# Patient Record
Sex: Male | Born: 1965
Health system: Southern US, Community
[De-identification: ages and names within clinical notes are randomized; demographics above are authoritative.]

## PROBLEM LIST (undated history)

## (undated) DIAGNOSIS — B019 Varicella without complication: Secondary | ICD-10-CM

## (undated) DIAGNOSIS — K219 Gastro-esophageal reflux disease without esophagitis: Secondary | ICD-10-CM

## (undated) DIAGNOSIS — T7840XA Allergy, unspecified, initial encounter: Secondary | ICD-10-CM

## (undated) HISTORY — DX: Allergy, unspecified, initial encounter: T78.40XA

## (undated) HISTORY — PX: KNEE ARTHROSCOPY W/ MEDIAL COLLATERAL LIGAMENT (MCL) REPAIR: SHX1876

## (undated) HISTORY — PX: SHOULDER SURGERY: SHX246

## (undated) HISTORY — PX: KNEE CARTILAGE SURGERY: SHX688

## (undated) HISTORY — DX: Gastro-esophageal reflux disease without esophagitis: K21.9

## (undated) HISTORY — DX: Varicella without complication: B01.9

## (undated) HISTORY — PX: ANTERIOR CRUCIATE LIGAMENT REPAIR: SHX115

---

## 2004-08-26 HISTORY — PX: NECK SURGERY: SHX720

## 2004-12-05 ENCOUNTER — Ambulatory Visit: Payer: Self-pay | Admitting: Orthopaedic Surgery

## 2004-12-18 ENCOUNTER — Ambulatory Visit: Payer: Self-pay | Admitting: Neurosurgery

## 2004-12-31 ENCOUNTER — Ambulatory Visit (HOSPITAL_COMMUNITY): Admission: RE | Admit: 2004-12-31 | Discharge: 2005-01-01 | Payer: Self-pay | Admitting: Neurosurgery

## 2005-01-29 ENCOUNTER — Encounter: Admission: RE | Admit: 2005-01-29 | Discharge: 2005-01-29 | Payer: Self-pay | Admitting: Neurosurgery

## 2005-04-16 ENCOUNTER — Encounter: Admission: RE | Admit: 2005-04-16 | Discharge: 2005-04-16 | Payer: Self-pay | Admitting: Neurosurgery

## 2005-11-21 IMAGING — RF DG CERVICAL SPINE 2 OR 3 VIEWS
1 series · 2 of 2 positions shown · non-contrast
Comparison: none

CLINICAL DATA: 39-year-old.  C6-7 fusion. 
CERVICAL SPINE - 1 VIEW:
Lateral cervical spine films from the operating room demonstrate anterior plate and screws and interbody bone plug fusion at C6-7.  Normal alignment.  No complicating features.

[Series 1: run · 2 of 2 slices shown]
[im 1/2]
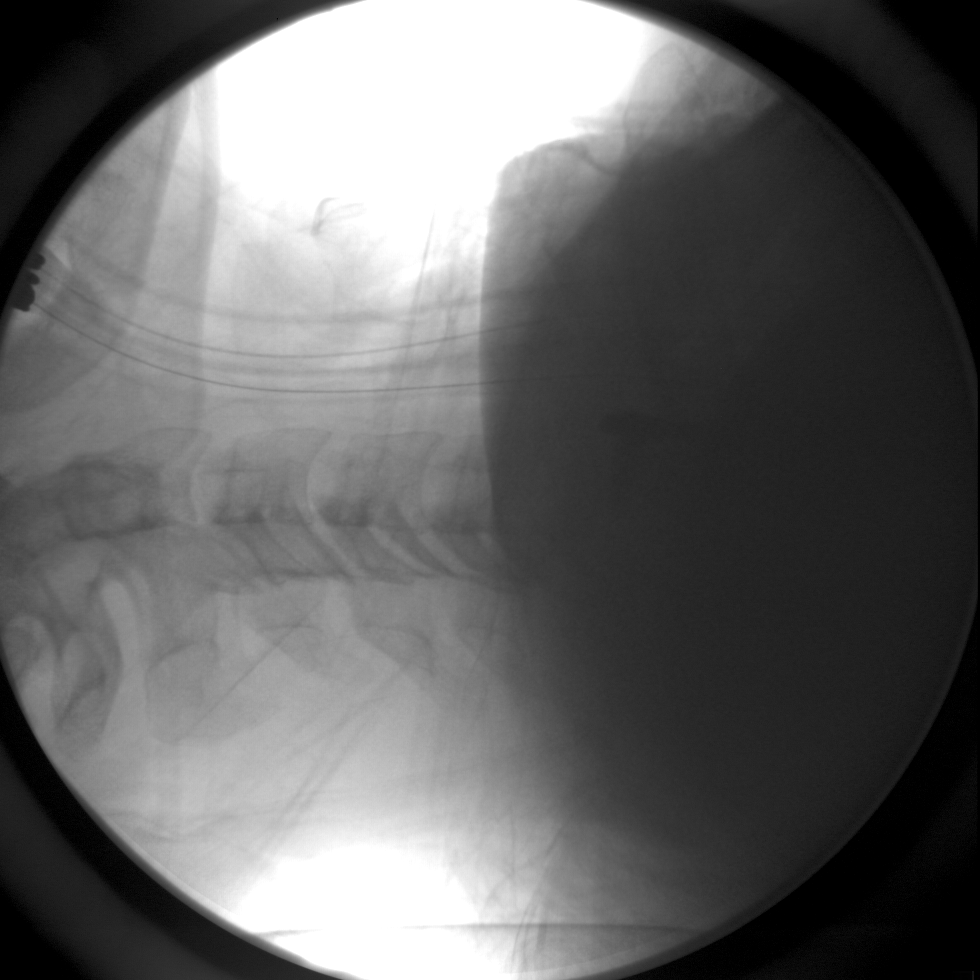
[im 2/2]
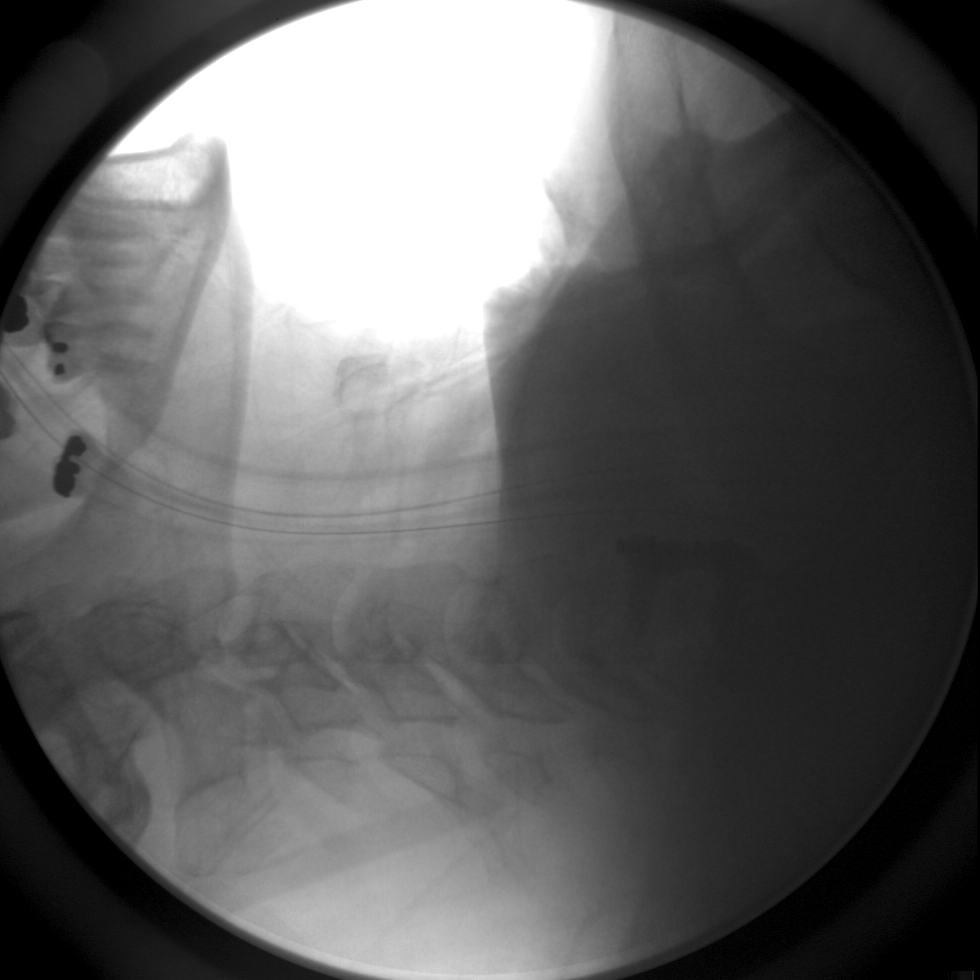

[2 of 2 positions shown; findings below may reference images not displayed]

IMPRESSION: C6-7 fusion.

## 2008-04-25 ENCOUNTER — Encounter: Payer: Self-pay | Admitting: Family Medicine

## 2008-04-25 LAB — CONVERTED CEMR LAB: LDL Cholesterol: 59 mg/dL

## 2008-05-17 ENCOUNTER — Ambulatory Visit: Payer: Self-pay | Admitting: Family Medicine

## 2008-05-17 DIAGNOSIS — R0789 Other chest pain: Secondary | ICD-10-CM | POA: Insufficient documentation

## 2008-05-17 DIAGNOSIS — J302 Other seasonal allergic rhinitis: Secondary | ICD-10-CM | POA: Insufficient documentation

## 2008-05-17 DIAGNOSIS — R519 Headache, unspecified: Secondary | ICD-10-CM | POA: Insufficient documentation

## 2008-05-17 DIAGNOSIS — K219 Gastro-esophageal reflux disease without esophagitis: Secondary | ICD-10-CM | POA: Insufficient documentation

## 2008-05-17 DIAGNOSIS — R51 Headache: Secondary | ICD-10-CM | POA: Insufficient documentation

## 2008-05-18 ENCOUNTER — Encounter: Payer: Self-pay | Admitting: Family Medicine

## 2008-05-30 ENCOUNTER — Ambulatory Visit: Payer: Self-pay

## 2008-05-30 ENCOUNTER — Encounter: Payer: Self-pay | Admitting: Family Medicine

## 2008-11-02 ENCOUNTER — Ambulatory Visit: Payer: Self-pay | Admitting: Family Medicine

## 2008-11-02 LAB — CONVERTED CEMR LAB
Bilirubin Urine: NEGATIVE
Nitrite: NEGATIVE

## 2010-03-01 ENCOUNTER — Ambulatory Visit: Payer: Self-pay | Admitting: Orthopaedic Surgery

## 2010-04-25 ENCOUNTER — Ambulatory Visit: Payer: Self-pay | Admitting: Orthopaedic Surgery
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
## 2013-12-02 ENCOUNTER — Institutional Professional Consult (permissible substitution): Payer: Self-pay | Admitting: Pulmonary Disease

## 2013-12-24 ENCOUNTER — Encounter: Payer: Self-pay | Admitting: Pulmonary Disease

## 2013-12-27 ENCOUNTER — Institutional Professional Consult (permissible substitution): Payer: Self-pay | Admitting: Pulmonary Disease

## 2014-01-19 ENCOUNTER — Encounter: Payer: Self-pay | Admitting: Pulmonary Disease

## 2014-01-19 ENCOUNTER — Ambulatory Visit (INDEPENDENT_AMBULATORY_CARE_PROVIDER_SITE_OTHER): Payer: BC Managed Care – PPO | Admitting: Pulmonary Disease

## 2014-01-19 ENCOUNTER — Encounter (INDEPENDENT_AMBULATORY_CARE_PROVIDER_SITE_OTHER): Payer: Self-pay

## 2014-01-19 VITALS — BP 140/90 | HR 75 | Temp 98.1°F | Ht 70.0 in | Wt 222.6 lb

## 2014-01-19 DIAGNOSIS — G4733 Obstructive sleep apnea (adult) (pediatric): Secondary | ICD-10-CM | POA: Insufficient documentation

## 2014-01-19 NOTE — Assessment & Plan Note (Signed)
The patient has a history of sleep apnea in the past, but did not do well with CPAP. However, it does not appear that he had good followup or trouble shooting. The patient is not symptomatic during the day, but does have restless and nonrestorative sleep, and also interferes with his wife's sleep. He is interested in treating this aggressively while he is working on weight loss. The severity of his sleep apnea really makes a difference, and we'll therefore do home sleep testing to reevaluate. If he has moderate or less sleep apnea, he can consider surgery, dental appliance, or just take 6 months to continue working on weight loss.

## 2014-01-19 NOTE — Patient Instructions (Signed)
Will schedule for home sleep testing, and will call once results are available. Work on weight reduction 

## 2014-01-19 NOTE — Progress Notes (Signed)
Subjective:    Patient ID: Troy Perry, male    DOB: 27-Mar-1966, 48 y.o.   MRN: [id number]  HPI The patient is a 48 year old male who I've been asked to see for obstructive sleep apnea. He apparently had a sleep study 7-8 years ago, and was told that he had mild to moderate sleep apnea. He was started on CPAP, but had no input for followup for his device. He could not tolerate the pressure, and therefore discontinued. He currently has been told that he has loud snoring as well as restless sleep. He notes choking arousals during the night, as well as frequent awakenings. He is not rested in the mornings upon arising. However, he has no issues with inappropriate daytime sleepiness or in the evening, and no sleepiness with driving. He is concerned about the impact to his spouse his sleep. The patient states that his weight is neutral from his previous sleep study, and his Epworth score today is only 1   Sleep Questionnaire What time do you typically go to bed?( Between what hours) 10-11pm 10-11pm at 1022 on 01/19/14 by Lilli Few, CMA How long does it take you to fall asleep? 10 minutes 10 minutes at 1022 on 01/19/14 by Lilli Few, CMA How many times during the night do you wake up? 3 3 at 1022 on 01/19/14 by Lilli Few, CMA What time do you get out of bed to start your day? 0600 0600 at 1022 on 01/19/14 by Lilli Few, CMA Do you drive or operate heavy machinery in your occupation? No No at 1022 on 01/19/14 by Lilli Few, CMA How much has your weight changed (up or down) over the past two years? (In pounds) 20 lb (9.072 kg) 20 lb (9.072 kg) at 1022 on 01/19/14 by Lilli Few, CMA Have you ever had a sleep study before? Yes Yes at 1022 on 01/19/14 by Lilli Few, CMA If yes, location of study? [place], [place] at 1022 on 01/19/14 by Lilli Few, CMA If yes, date of study?  2010 2010 at 1022 on 01/19/14 by Lilli Few, CMA Do you currently use CPAP? No No at 1022 on 01/19/14 by Lilli Few, CMA Do you wear oxygen at any time? No No at 1022 on 01/19/14 by Lilli Few, CMA   Review of Systems  Constitutional: Negative for fever and unexpected weight change.  HENT: Positive for congestion and postnasal drip. Negative for dental problem, ear pain, nosebleeds, rhinorrhea, sinus pressure, sneezing, sore throat and trouble swallowing.   Eyes: Negative for redness and itching.  Respiratory: Negative for cough, chest tightness, shortness of breath and wheezing.   Cardiovascular: Negative for palpitations and leg swelling.  Gastrointestinal: Negative for nausea and vomiting.  Genitourinary: Negative for dysuria.  Musculoskeletal: Negative for joint swelling.  Skin: Negative for rash.  Neurological: Negative for headaches.  Hematological: Does not bruise/bleed easily.  Psychiatric/Behavioral: Negative for dysphoric mood. The patient is not nervous/anxious.        Objective:   Physical Exam Constitutional:  Overweight male, no acute distress  HENT:  Nares patent without discharge, deviated septum to left  Oropharynx without exudate, palate and uvula are moderately elongated.  Eyes:  Perrla, eomi, no scleral icterus  Neck:  No JVD, no TMG  Cardiovascular:  Normal rate, regular rhythm, no rubs or gallops.  No murmurs        Intact distal pulses  Pulmonary :  Normal breath sounds, no stridor or respiratory distress   No rales, rhonchi, or wheezing  Abdominal:  Soft, nondistended, bowel sounds present.  No tenderness noted.   Musculoskeletal:  No lower extremity edema noted.  Lymph Nodes:  No cervical lymphadenopathy noted  Skin:  No cyanosis noted  Neurologic:  Alert, appropriate, moves all 4 extremities without obvious deficit.         Assessment & Plan:

## 2014-02-17 DIAGNOSIS — G4730 Sleep apnea, unspecified: Secondary | ICD-10-CM

## 2014-02-17 DIAGNOSIS — G4710 Hypersomnia, unspecified: Secondary | ICD-10-CM

## 2014-03-03 ENCOUNTER — Encounter: Payer: Self-pay | Admitting: Pulmonary Disease

## 2014-03-03 ENCOUNTER — Telehealth: Payer: Self-pay | Admitting: Pulmonary Disease

## 2014-03-03 DIAGNOSIS — G4710 Hypersomnia, unspecified: Secondary | ICD-10-CM

## 2014-03-03 DIAGNOSIS — G4730 Sleep apnea, unspecified: Secondary | ICD-10-CM

## 2014-03-03 NOTE — Telephone Encounter (Signed)
Pt needs ov to review his sleep study 

## 2014-03-04 NOTE — Telephone Encounter (Signed)
[email or web address].Jennifer Castillo, CMA  

## 2014-03-08 NOTE — Telephone Encounter (Signed)
appt set for Thursday at [place], [place]

## 2014-03-10 ENCOUNTER — Ambulatory Visit (INDEPENDENT_AMBULATORY_CARE_PROVIDER_SITE_OTHER): Payer: BC Managed Care – PPO | Admitting: Pulmonary Disease

## 2014-03-10 ENCOUNTER — Encounter (INDEPENDENT_AMBULATORY_CARE_PROVIDER_SITE_OTHER): Payer: Self-pay

## 2014-03-10 ENCOUNTER — Encounter: Payer: Self-pay | Admitting: Pulmonary Disease

## 2014-03-10 VITALS — BP 110/70 | HR 79 | Temp 98.2°F | Ht 70.0 in | Wt 220.0 lb

## 2014-03-10 DIAGNOSIS — G4733 Obstructive sleep apnea (adult) (pediatric): Secondary | ICD-10-CM

## 2014-03-10 NOTE — Patient Instructions (Signed)
Will refer you to Dr. Ron Parker for consideration of a dental appliance.  If not a candidate, or if you do not do well, we can then consider cpap. Work on weight loss. Will see you in followup once all of your workup is done and after you have been fitted with an appliance.  Keep [place] in the loop.

## 2014-03-10 NOTE — Progress Notes (Signed)
   Subjective:    Patient ID: Troy Perry, male    DOB: [birth year], 48 y.o.   MRN: [id number]  HPI Patient comes in today for followup of his recent home sleep test. He was found to have moderate OSA, with an AHI of 26 events per hour and significant oxygen desaturation. I have reviewed the study with him in detail, and answered all of his questions.   Review of Systems  Constitutional: Negative for fever and unexpected weight change.  HENT: Negative for congestion, dental problem, ear pain, nosebleeds, postnasal drip, rhinorrhea, sinus pressure, sneezing, sore throat and trouble swallowing.   Eyes: Negative for redness and itching.  Respiratory: Negative for cough, chest tightness, shortness of breath and wheezing.   Cardiovascular: Negative for palpitations and leg swelling.  Gastrointestinal: Negative for nausea and vomiting.  Genitourinary: Negative for dysuria.  Musculoskeletal: Negative for joint swelling.  Skin: Negative for rash.  Neurological: Negative for headaches.  Hematological: Does not bruise/bleed easily.  Psychiatric/Behavioral: Negative for dysphoric mood. The patient is not nervous/anxious.        Objective:   Physical Exam Overweight male in no acute distress Nose without purulence or discharge noted Neck without lymphadenopathy or thyromegaly Lower extremities without edema, no cyanosis Awake but appears sleepy, moves all 4 extremities.       Assessment & Plan:

## 2014-03-10 NOTE — Assessment & Plan Note (Signed)
The patient has moderate obstructive sleep apnea by his recent home sleep test, and he is clearly symptomatic. I have discussed with him treatment with CPAP as well as a dental appliance, and he is very concerned about trying CPAP again. He feels that it does not fit his active lifestyle, but he is willing to try it again if the dental appliance is not sufficient. I will refer him to dental medicine, and I've also encouraged him to work aggressively on weight loss.

## 2014-08-17 ENCOUNTER — Telehealth: Payer: Self-pay | Admitting: Pulmonary Disease

## 2014-08-17 NOTE — Telephone Encounter (Signed)
Let pt know that I have only evaluated him for sleep apnea.  I would recommend that he call his primary doctor about his symptoms of sob, and let them evaluate him.  If they feel he needs to see a specialist, I would be happy to see him. If he feels his sob worsens in the interim, may need to go to ER for evaluation.

## 2014-08-17 NOTE — Telephone Encounter (Signed)
Spoke with pt, advised him of kc's recs.  Nothing further needed at this time.

## 2014-08-17 NOTE — Telephone Encounter (Signed)
Called and spoke to pt. Pt stated since he has started on the dental appliance he has had less apena during the night per Dr. Ron Parker. Pt was informed per Dr. Ron Parker that his level was "low" during the night with the dental appliance at 91%, consistently. Pt stated he is now feels more SOB during the day like he is unable to take a full breath, like "his chest is full of air", and when he becomes SOB he has to "mentally relax" to catch his breath. Pt also c/o chest pressure at times when SOB. S/s/ x 2 weeks.   [place] please advise.

## 2014-09-16 ENCOUNTER — Institutional Professional Consult (permissible substitution): Payer: BC Managed Care – PPO | Admitting: Pulmonary Disease

## 2014-11-07 ENCOUNTER — Ambulatory Visit (INDEPENDENT_AMBULATORY_CARE_PROVIDER_SITE_OTHER)
Admission: RE | Admit: 2014-11-07 | Discharge: 2014-11-07 | Disposition: A | Discharge status: Home / Self Care | Payer: BLUE CROSS/BLUE SHIELD | Source: Ambulatory Visit | Attending: Internal Medicine | Admitting: Internal Medicine

## 2014-11-07 ENCOUNTER — Ambulatory Visit (INDEPENDENT_AMBULATORY_CARE_PROVIDER_SITE_OTHER): Payer: BLUE CROSS/BLUE SHIELD | Admitting: Internal Medicine

## 2014-11-07 ENCOUNTER — Encounter: Payer: Self-pay | Admitting: Internal Medicine

## 2014-11-07 ENCOUNTER — Encounter (INDEPENDENT_AMBULATORY_CARE_PROVIDER_SITE_OTHER): Payer: Self-pay

## 2014-11-07 ENCOUNTER — Other Ambulatory Visit: Payer: Self-pay | Admitting: Internal Medicine

## 2014-11-07 VITALS — BP 124/90 | HR 63 | Temp 98.8°F | Wt 221.0 lb

## 2014-11-07 DIAGNOSIS — K219 Gastro-esophageal reflux disease without esophagitis: Secondary | ICD-10-CM

## 2014-11-07 DIAGNOSIS — N529 Male erectile dysfunction, unspecified: Secondary | ICD-10-CM

## 2014-11-07 DIAGNOSIS — G4733 Obstructive sleep apnea (adult) (pediatric): Secondary | ICD-10-CM

## 2014-11-07 DIAGNOSIS — R0602 Shortness of breath: Secondary | ICD-10-CM

## 2014-11-07 DIAGNOSIS — J302 Other seasonal allergic rhinitis: Secondary | ICD-10-CM

## 2014-11-07 DIAGNOSIS — R197 Diarrhea, unspecified: Secondary | ICD-10-CM

## 2014-11-07 NOTE — Assessment & Plan Note (Signed)
ECG normal Will obtain chest xray If chest xray is normal- will refer to cardiology for further evaluation Depending on what cardiology find's, may need referral to pulmonology

## 2014-11-07 NOTE — Patient Instructions (Signed)

## 2014-11-07 NOTE — Progress Notes (Signed)
HPI  Pt presents to the clinic today to establish care and for management of the conditions listed below. He has not had a PCP in many years.   Allergies: Seasonal, worse is spring and fall. He will take Sudafed when his symptoms occur. He reports he can not take antihistamines because they make him too drowsy.  GERD: Occasional. Triggered by certain foods. He does not take any medication for this but does try to make changes in his diet to avoid foods that make his reflux worse.  [facility]: [facility] He is getting weekly injections.  OSA: He does not tolerate the CPAP.   He also c/o frequent loose stools. This started many years ago. He reports about 1 hour after he meal, he has to have a bowel movement. It doesn't seem to be related to eating certain types of foods. He has also notice that he belches frequently. No abdominal pain, or blood in stool. His diet has improved over the last few years, and he is eating more fiber. Stress does seem to make it worse. He reports his job is very stressful, especially over the last year. He has not tried anything to make it better, but reports he has just been dealing with it.  He also c/o shortness of breath. This started about 6 months ago. He has difficulty with taking a deep breath. He reports this occurs on a weekly basis but it is intermittent. He does have some associated dizziness, that only last for a few seconds. He bikes and runs, and will occasionally feel shortness of breath with exercise, but it does not occur every time he exercises. Laying flat makes it worse. Yoga breathing seems to help. Laying flat makes it worse. He is sleeping elevated on pillows. No chest pain or chest tightness. He has no history of allergies or breathing problems.  Flu: never Tetanus: ? 2011 PSA Screening: never Colon Screening: never Vision Screening: 10/20/14 at [facility] Dentist: biannually  Past Medical  History  Diagnosis Date  . Allergic rhinitis   . Chicken pox   . GERD (gastroesophageal reflux disease)   . Allergy     Current Outpatient Prescriptions  Medication Sig Dispense Refill  . aspirin 81 MG tablet Take 81 mg by mouth daily.    . Misc Natural Products (GLUCOSAMINE CHONDROITIN ADV PO) Take 1 capsule by mouth 2 (two) times daily.    . Multiple Vitamins-Minerals (MULTIVITAMIN WITH MINERALS) tablet Take 1 tablet by mouth daily.    Marland Kitchen testosterone cypionate (DEPOTESTOTERONE CYPIONATE) 200 MG/ML injection Inject 100 mg into the muscle once a week.      No current facility-administered medications for this visit.    No Known Allergies  Family History  Problem Relation Age of Onset  . Hyperlipidemia Father   . Hypertension Father   . Diabetes Father   . Arthritis Maternal Grandfather   . Cancer Maternal Grandfather     Prostate  . Diabetes Paternal Grandfather     History   Social History  . Marital Status: Married    Spouse Name: N/A  . Number of Children: N/A  . Years of Education: N/A   Occupational History  . IT     Social History Main Topics  . Smoking status: Never Smoker   . Smokeless tobacco: Not on file  . Alcohol Use: 1.0 oz/week    2 Standard drinks or equivalent per week  . Drug Use: No  . Sexual Activity: Not  on file   Other Topics Concern  . Not on file   Social History Narrative    ROS:  Constitutional: Pt reports fatigue. Denies fever, malaise, headache or abrupt weight changes.  HEENT: Denies eye pain, eye redness, ear pain, ringing in the ears, wax buildup, runny nose, nasal congestion, bloody nose, or sore throat. Respiratory: Pt reports shortness of breath. Denies difficulty breathing, cough or sputum production.   Cardiovascular: Denies chest pain, chest tightness, palpitations or swelling in the hands or feet.  Gastrointestinal: Pt reports frequent loose stool. Denies abdominal pain, bloating, constipation, diarrhea or blood in the  stool.  GU: Pt reports difficulty maintaining an erection. Denies frequency, urgency, pain with urination, blood in urine, odor or discharge. Musculoskeletal: Pt reports knee and shoulder pain due to previous injury/surgery. Denies decrease in range of motion, difficulty with gait, muscle pain or joint swelling.  Skin: Denies redness, rashes, lesions or ulcercations.  Neurological: Pt reports dizziness. Denies difficulty with memory, difficulty with speech or problems with balance and coordination.  Psych: Pt reports stress but denies anxiety, depression, SI/HI.  No other specific complaints in a complete review of systems (except as listed in HPI above).  PE:  BP 124/90 mmHg  Pulse 63  Temp(Src) 98.8 F (37.1 C) (Oral)  Wt 221 lb (100.245 kg)  SpO2 98%  Wt Readings from Last 3 Encounters:  11/07/14 221 lb (100.245 kg)  03/10/14 220 lb (99.791 kg)  01/19/14 222 lb 9.6 oz (100.971 kg)    General: Appears his stated age, well developed, well nourished in NAD. Skin: Warm dry and intact, no rashes, lesions or ulcerations noted. Neck: Neck supple, trachea midline. No masses, lumps or thyromegaly present.  Cardiovascular: Normal rate and rhythm. S1,S2 noted.  No murmur, rubs or gallops noted. No JVD or BLE edema. No carotid bruits noted. Pulmonary/Chest: Normal effort and positive vesicular breath sounds. No respiratory distress. No wheezes, rales or ronchi noted.  Abdomen: Soft and nontender. Normal bowel sounds, no bruits noted. No distention or masses noted.  Musculoskeletal: Normal range of motion. Strength 5/5 BUE/BLE. No difficulty with gait.  Neurological: Alert and oriented.  Psychiatric: Appears anxious when discussing his SOB. Affect normal.   Lipid Panel     Component Value Date/Time   HDL 49 04/25/2008   LDLCALC 59 04/25/2008     Assessment and Plan:

## 2014-11-07 NOTE — Assessment & Plan Note (Signed)
Continue Sudafed prn Advised him not to take for more than 3 days at a time

## 2014-11-07 NOTE — Assessment & Plan Note (Signed)
Will discuss at next visit as workup for SOB is priority at this time

## 2014-11-07 NOTE — Assessment & Plan Note (Signed)
Advised him to continue to avoid foods that trigger his reflux He will let me know if this starts happening on a more frequent basis

## 2014-11-07 NOTE — Progress Notes (Signed)
Pre visit review using our clinic review tool, if applicable. No additional management support is needed unless otherwise documented below in the visit note. 

## 2014-11-07 NOTE — Assessment & Plan Note (Signed)
Improved with weight loss He has not tolerated CPAP in the past He reports he recently got a dental appliance which has seemed to help.

## 2014-11-07 NOTE — Assessment & Plan Note (Signed)
Sounds like IBS Will discuss at next visit as workup for SOB is priority at this time

## 2014-11-28 ENCOUNTER — Ambulatory Visit (INDEPENDENT_AMBULATORY_CARE_PROVIDER_SITE_OTHER): Payer: BLUE CROSS/BLUE SHIELD | Admitting: Cardiovascular Disease

## 2014-11-28 ENCOUNTER — Encounter: Payer: Self-pay | Admitting: Cardiovascular Disease

## 2014-11-28 VITALS — BP 120/92 | HR 70 | Ht 70.0 in | Wt 218.8 lb

## 2014-11-28 DIAGNOSIS — R0789 Other chest pain: Secondary | ICD-10-CM | POA: Diagnosis not present

## 2014-11-28 DIAGNOSIS — G4733 Obstructive sleep apnea (adult) (pediatric): Secondary | ICD-10-CM

## 2014-11-28 DIAGNOSIS — N529 Male erectile dysfunction, unspecified: Secondary | ICD-10-CM

## 2014-11-28 DIAGNOSIS — R0602 Shortness of breath: Secondary | ICD-10-CM | POA: Diagnosis not present

## 2014-11-28 NOTE — Progress Notes (Signed)
Patient ID: Troy Perry, male    DOB: December 23, 1965, 49 y.o.   MRN: [id number]  HPI Comments: Mr. Troy Perry is a 49 year old gentleman with long history of obstructive sleep apnea, noncompliant with CPAP he uses a dental appliance periodically, mild obesity with weight down to 220 pounds, managed by Dr. Eddie Dibbles for low testosterone, borderline elevated glucose levels with hemoglobin A1c in the low 6 range, presenting for evaluation of shortness of breath.  He reports that he is training for long distance running and over the course of the past year since August 2015, has had preceding shortness of breath with exertion. He is unable to reach peak performance as he was able to previously. He even had some shortness of breath at rest. Symptoms have been severe almost to the point where he thought he needed to call 911. He is uncertain if something is wrong with his heart, lungs or if he is having anxiety. He does report significant stress at work. He feels symptoms possibly from excess stress.  With recent weight loss of 10 pounds, in the past month or so he has had significantly less shortness of breath episodes. He's been running more, in preparation for half marathon next week.  He monitors his heart rate and blood pressure. Typically heart rate is appropriate for his exertion. He tries to exercise almost daily.   He does report having previous workup. Treadmill Myoview in 2009 showed no ischemia, good exercise tolerance, 13.7 METS, peak heart rate close to 160 bpm.  EKG on today's visit shows normal sinus rhythm with rate 70 bpm, no significant ST or T-wave changes He reports having elevated triglycerides   No Known Allergies  Outpatient Encounter Prescriptions as of 11/28/2014  Medication Sig  . aspirin 81 MG tablet Take 81 mg by mouth daily.  . Misc Natural Products (GLUCOSAMINE CHONDROITIN ADV PO) Take 1 capsule by mouth 2 (two) times daily.  . Multiple Vitamins-Minerals (MULTIVITAMIN WITH MINERALS)  tablet Take 1 tablet by mouth daily.  Marland Kitchen testosterone cypionate (DEPOTESTOTERONE CYPIONATE) 200 MG/ML injection Inject 100 mg into the muscle once a week.     Past Medical History  Diagnosis Date  . Chicken pox   . GERD (gastroesophageal reflux disease)   . Allergy     Past Surgical History  Procedure Laterality Date  . Anterior cruciate ligament repair Left   . Knee arthroscopy w/ medial collateral ligament (mcl) repair Left   . Knee cartilage surgery Bilateral   . Neck surgery  2006    C6, C7  . Shoulder surgery Left     and arm    Social History  reports that he has never smoked. He has never used smokeless tobacco. He reports that he drinks about 6.0 oz of alcohol per week. He reports that he does not use illicit drugs.  Family History family history includes Arthritis in his maternal grandfather; Cancer in his maternal grandfather; Diabetes in his father and paternal grandfather; Hyperlipidemia in his father; Hypertension in his father.   Review of Systems  Constitutional: Positive for fatigue.  Respiratory: Positive for shortness of breath.   Cardiovascular: Negative.   Gastrointestinal: Negative.   Musculoskeletal: Negative.   Skin: Negative.   Neurological: Negative.   Hematological: Negative.   Psychiatric/Behavioral: Negative.   All other systems reviewed and are negative.   BP 120/92 mmHg  Pulse 70  Ht 5\' 10"  (1.778 m)  Wt 218 lb 12 oz (99.224 kg)  BMI 31.39 kg/m2  Physical Exam  Constitutional: He is oriented to person, place, and time. He appears well-developed and well-nourished.  HENT:  Head: Normocephalic.  Nose: Nose normal.  Mouth/Throat: Oropharynx is clear and moist.  Eyes: Conjunctivae are normal. Pupils are equal, round, and reactive to light.  Neck: Normal range of motion. Neck supple. No JVD present.  Cardiovascular: Normal rate, regular rhythm, S1 normal, S2 normal, normal heart sounds and intact distal pulses.  Exam reveals no gallop  and no friction rub.   No murmur heard. Pulmonary/Chest: Effort normal and breath sounds normal. No respiratory distress. He has no wheezes. He has no rales. He exhibits no tenderness.  Abdominal: Soft. Bowel sounds are normal. He exhibits no distension. There is no tenderness.  Musculoskeletal: Normal range of motion. He exhibits no edema or tenderness.  Lymphadenopathy:    He has no cervical adenopathy.  Neurological: He is alert and oriented to person, place, and time. Coordination normal.  Skin: Skin is warm and dry. No rash noted. No erythema.  Psychiatric: He has a normal mood and affect. His behavior is normal. Judgment and thought content normal.      Assessment and Plan   Nursing note and vitals reviewed.

## 2014-11-28 NOTE — Assessment & Plan Note (Signed)
Shortness of breath waxes and wanes. Recently has been relatively benign and he has been exercising to a high intensity level. Symptoms better Possibly from recent weight loss. Explains his pattern was not consistent with ischemia the we have offered routine treadmill study if he would like. Echocardiogram also could be done to rule out pulmonary hypertension. This will be ordered at his convenience. Encouraged him to continue exercise symptoms will likely improve with weight loss and conditioning. Less likely a pulmonary issue. Episodes possibly related to stress or panic attacks. Work has been very stressful for him

## 2014-11-28 NOTE — Assessment & Plan Note (Signed)
Low testosterone managed by Dr. Eddie Dibbles

## 2014-11-28 NOTE — Assessment & Plan Note (Signed)
He does not use his CPAP. Most nights does not use his dental device. Last night he slept on the couch as he was snoring more. Explained to him that this could contribute to his shortness of breath episodes.

## 2014-11-28 NOTE — Patient Instructions (Signed)
You are doing well. No medication changes were made.  Please call if you would like an echocardiogram for shortness of breath  Please call us if you have new issues that need to be addressed before your next appt.

## 2014-12-27 ENCOUNTER — Other Ambulatory Visit: Payer: BLUE CROSS/BLUE SHIELD

## 2014-12-27 ENCOUNTER — Ambulatory Visit (INDEPENDENT_AMBULATORY_CARE_PROVIDER_SITE_OTHER): Payer: BLUE CROSS/BLUE SHIELD

## 2014-12-27 ENCOUNTER — Other Ambulatory Visit: Payer: Self-pay

## 2014-12-27 DIAGNOSIS — R0602 Shortness of breath: Secondary | ICD-10-CM

## 2014-12-27 DIAGNOSIS — R0789 Other chest pain: Secondary | ICD-10-CM

## 2015-01-04 ENCOUNTER — Telehealth: Payer: Self-pay | Admitting: Cardiovascular Disease

## 2015-01-04 NOTE — Telephone Encounter (Signed)
Less likely a pulmonary issue Next at if he is interested would be VO2 max treadmill stress test with Dr. Haroldine Laws

## 2015-01-04 NOTE — Telephone Encounter (Signed)
I called and spoke with the patient regarding his echo results. He reports that he is still having some intermittent SOB. He has seen Dr. Gwenette Greet back in 02/2014 for OSA. He reports Dr. Gwenette Greet wanted him to follow back up with cardiology and told the patient cardiology/ another MD would need to refer him back to pulmonary if any further work up was needed. The patient is requesting a referral back to Dr. Gwenette Greet for his continued intermittent SOB. I advised I would forward to Dr. Rockey Situ to ok a referral back to Dr. Gwenette Greet. We will follow back up with him after Dr. Rockey Situ reviews. He is agreeable.

## 2015-01-05 NOTE — Telephone Encounter (Signed)
If he feels his sleep apnea is a issue Would recommend he see Dr. Normajean Baxter for further evaluation, possible retry of the CPAP  If he feels it is exertional related, would see Dr. Haroldine Laws for VO2 MAX treadmill stress.

## 2015-01-05 NOTE — Telephone Encounter (Signed)
Spoke w/ pt.  Advised him of Dr. Donivan Scull recommendation.  He states that his issue is w/ his breathing and he wants to make sure that this is clarified.  Advised him that this was discussed during his last ov and GXT was also offered to him.  He reports that his SOB is worsening, worse at night when he lies down or is horizontal.  He is agreeable to proceeding w/ stress test if Dr. Rockey Situ feels this is absolutely necessary, but he does feel that is a lung issue and requests referral to Dr. Gwenette Greet.

## 2015-01-05 NOTE — Telephone Encounter (Signed)
Attempted to contact pt.  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  Will attempt to contact him later.

## 2015-01-06 NOTE — Telephone Encounter (Signed)
Left message for pt to call back  °

## 2015-01-10 NOTE — Telephone Encounter (Signed)
Left message for pt to call back  °

## 2015-02-03 ENCOUNTER — Telehealth: Payer: Self-pay | Admitting: Cardiovascular Disease

## 2015-02-03 DIAGNOSIS — R0602 Shortness of breath: Secondary | ICD-10-CM

## 2015-02-03 NOTE — Telephone Encounter (Signed)
Please call patient to schedule a stress test

## 2015-02-06 NOTE — Telephone Encounter (Signed)
Spoke w/ Troy Perry in HF clinic. Sent staff message to Kevan Rosebush, RN w/ pt's info. They will contact pt w/ appt for VO2 max treadmill stress test.

## 2015-02-06 NOTE — Telephone Encounter (Signed)
Left message for Dr. Clayborne Dana nurse to call back to set up VO2 Max treadmill stress test.

## 2015-02-28 NOTE — Telephone Encounter (Signed)
Spoke w/ Nira Conn, RN. Clarified the type of test that was indeed ordered, CPX can be ordered thru their office, but VO2 max treadmill involves a heart cath while pt is on a treadmill. Pt sched for CPX Friday, July 8 @ 8:30, arrive at the [facility] @ 8:15. Pt has several questions regarding the procedure itself and will call Camila to go over these.  Pt will call back w/ any questions or concerns.

## 2015-02-28 NOTE — Telephone Encounter (Signed)
Patient called stating that he hasn't been scheduled for his stress test. Patient states that he spoke with Dr. Clayborne Dana nurse and that he was told to call the [facility] office and start over again. That she wasn't sent the message to call patient.

## 2015-03-03 ENCOUNTER — Ambulatory Visit (HOSPITAL_COMMUNITY): Payer: BLUE CROSS/BLUE SHIELD | Attending: Cardiovascular Disease

## 2015-03-03 DIAGNOSIS — R0602 Shortness of breath: Secondary | ICD-10-CM

## 2015-04-03 ENCOUNTER — Encounter: Payer: Self-pay | Admitting: Internal Medicine

## 2015-04-03 ENCOUNTER — Ambulatory Visit (INDEPENDENT_AMBULATORY_CARE_PROVIDER_SITE_OTHER): Payer: BLUE CROSS/BLUE SHIELD | Admitting: Internal Medicine

## 2015-04-03 VITALS — BP 124/90 | HR 81 | Temp 98.9°F | Wt 224.0 lb

## 2015-04-03 DIAGNOSIS — R0602 Shortness of breath: Secondary | ICD-10-CM | POA: Diagnosis not present

## 2015-04-03 DIAGNOSIS — G4733 Obstructive sleep apnea (adult) (pediatric): Secondary | ICD-10-CM

## 2015-04-03 NOTE — Patient Instructions (Signed)

## 2015-04-03 NOTE — Progress Notes (Signed)
Pre visit review using our clinic review tool, if applicable. No additional management support is needed unless otherwise documented below in the visit note. 

## 2015-04-03 NOTE — Progress Notes (Signed)
Subjective:    Patient ID: Troy Perry, male    DOB: 07/27/1966, 49 y.o.   MRN: [id number]  HPI  Pt presents today to followup his cardiology appt. He was referred for shortness of breath. His symptoms seem worse at night though. The shortness of breath occurs at rest or with exertion. He feels like he can not fully take a deep breath and this makes him very anxious which makes the SOB worse. He can run and exercise with no trouble. His stress test with cardiology was normal and they advised him that the SOB was likely due to anxiety or OSA. He did have a sleep study done 1 year ago. He was given a dental guard which has helped but he does not wear it all the time. He does feel like his symptoms have improved with weight loss.  Review of Systems  Past Medical History  Diagnosis Date  . Chicken pox   . GERD (gastroesophageal reflux disease)   . Allergy     Current Outpatient Prescriptions  Medication Sig Dispense Refill  . aspirin 81 MG tablet Take 81 mg by mouth daily.    . Misc Natural Products (GLUCOSAMINE CHONDROITIN ADV PO) Take 1 capsule by mouth 2 (two) times daily.    . Multiple Vitamins-Minerals (MULTIVITAMIN WITH MINERALS) tablet Take 1 tablet by mouth daily.    Marland Kitchen testosterone cypionate (DEPOTESTOTERONE CYPIONATE) 200 MG/ML injection Inject 100 mg into the muscle once a week.      No current facility-administered medications for this visit.    No Known Allergies  Family History  Problem Relation Age of Onset  . Hyperlipidemia Father   . Hypertension Father   . Diabetes Father   . Arthritis Maternal Grandfather   . Cancer Maternal Grandfather     Prostate  . Diabetes Paternal Grandfather     History   Social History  . Marital Status: Married    Spouse Name: N/A  . Number of Children: N/A  . Years of Education: N/A   Occupational History  . IT     Social History Main Topics  . Smoking status: Never Smoker   . Smokeless tobacco: Never Used  . Alcohol  Use: 6.0 oz/week    2 Standard drinks or equivalent, 8 Glasses of wine per week  . Drug Use: No  . Sexual Activity: Yes   Other Topics Concern  . Not on file   Social History Narrative     Constitutional: Denies fever, malaise, fatigue, headache or abrupt weight changes.  HEENT: Denies eye pain, eye redness, ear pain, ringing in the ears, wax buildup, runny nose, nasal congestion, bloody nose, or sore throat. Respiratory: pt reports shortness of breath. Denies difficulty breathing, cough or sputum production.   Cardiovascular: Denies chest pain, chest tightness, palpitations or swelling in the hands or feet.  Neurological: Denies dizziness, difficulty with memory, difficulty with speech or problems with balance and coordination.   No other specific complaints in a complete review of systems (except as listed in HPI above).     Objective:   Physical Exam   BP 124/90 mmHg  Pulse 81  Temp(Src) 98.9 F (37.2 C) (Oral)  Wt 224 lb (101.606 kg)  SpO2 98% Wt Readings from Last 3 Encounters:  04/03/15 224 lb (101.606 kg)  11/28/14 218 lb 12 oz (99.224 kg)  11/07/14 221 lb (100.245 kg)    General: Appears his stated age, well developed, well nourished in NAD. Cardiovascular: Normal rate and  rhythm. S1,S2 noted.  No murmur, rubs or gallops noted.  Pulmonary/Chest: Normal effort and positive vesicular breath sounds. No respiratory distress. No wheezes, rales or ronchi noted.  Neurological: Alert and oriented.  Psychiatric: He seems anxious today.  BMET No results found for: NA, K, CL, CO2, GLUCOSE, BUN, CREATININE, CALCIUM, GFRNONAA, GFRAA  Lipid Panel     Component Value Date/Time   HDL 49 04/25/2008   LDLCALC 59 04/25/2008    CBC No results found for: WBC, RBC, HGB, HCT, PLT, MCV, MCH, MCHC, RDW, LYMPHSABS, MONOABS, EOSABS, BASOSABS  Hgb A1C No results found for: HGBA1C      Assessment & Plan:   Shortness of breath:  Cardiology notes reviewed Symptoms worse at  night Likely anxiety but he wants a referral to pulmonology for further evaluation Referral placed He is not interested in treatment of anxiety at this time  Will follow up after your pulmonology appt

## 2015-04-18 ENCOUNTER — Encounter: Payer: Self-pay | Admitting: Internal Medicine

## 2015-04-18 ENCOUNTER — Ambulatory Visit (INDEPENDENT_AMBULATORY_CARE_PROVIDER_SITE_OTHER): Payer: BLUE CROSS/BLUE SHIELD | Admitting: Internal Medicine

## 2015-04-18 VITALS — BP 142/96 | HR 77 | Ht 70.0 in | Wt 224.0 lb

## 2015-04-18 DIAGNOSIS — R197 Diarrhea, unspecified: Secondary | ICD-10-CM | POA: Diagnosis not present

## 2015-04-18 DIAGNOSIS — R0602 Shortness of breath: Secondary | ICD-10-CM | POA: Diagnosis not present

## 2015-04-18 DIAGNOSIS — G4733 Obstructive sleep apnea (adult) (pediatric): Secondary | ICD-10-CM

## 2015-04-18 DIAGNOSIS — K219 Gastro-esophageal reflux disease without esophagitis: Secondary | ICD-10-CM | POA: Diagnosis not present

## 2015-04-18 DIAGNOSIS — J302 Other seasonal allergic rhinitis: Secondary | ICD-10-CM

## 2015-04-18 NOTE — Patient Instructions (Signed)
Full PFT and follow up after.  Use biotin mouthwash every night.  Use nasacort nasal spray over the counter, 2 sprays in each nostril at bedtime.  Remove pets from sleeping environment.

## 2015-04-18 NOTE — Progress Notes (Signed)
[facility] Consultation      Assessment and Plan:  -Obstructive sleep apnea. Pt advised to wear his dental device regularly, can use Biotin mouthwash for lubrication.   -GERD. Episodic, occurs only once in a while.   -Anxiety May be caused by and contributing to dyspnea.   -Dyspnea. May be contributed by sleep apnea. Atypical, and occurs at rest. Will check a full PFT.   Allergic rhinitis.  Start nasal steroid every night, discussed removing pets from bed.    Date: 04/18/2015  MRN# [id number] Troy Perry 1965/09/30  Referring Physician:   Evie Perry is a 49 y.o. old male seen in consultation for chief complaint of:    Chief Complaint  Patient presents with  . Follow-up    uses dental appliance; when wears dental appliance feels rested; feels like it is hard to take deep breath at times;     HPI:   The patient is a 49year-old male who I've been asked to see for obstructive sleep apnea. He apparently had a sleep study several years ago, and was told that he had mild to moderate sleep apnea. He was started on CPAP, but had no input for followup for his device. He could not tolerate the pressure, and therefore discontinued. He was seen in [place] recently, he was sent for a home sleep study which showed an apnea-hypopnea index of 26. He iwas subsequently referred to dentistry for fitting of a dental device as he felt that he could not tolerate CPAP. He tells me that with the post-study his AHI dropped to 2, and he was no longer snoring.  He actually tried a CPAP 10 yrs ago. He was wearing a full mask due to mouth breathing, could not tolerate it due to claustrophobia, he tried in off and on for 2 years. He did OK with a nose mask but was getting nasal congestion, he was not tried on nasal spray at that time.   He notes choking arousals during the night, as well as frequent awakenings. He also notes trouble with dyspnea when laying down and laying flat. He is  been seen by his primary care and his cardiologist, he was recommended to wear his dental device more regularly as it was thought that his dyspnea may be due to sleep apnea as well as some panic episodes that may be occurring during the night from anxiety.  He underwent an echocardiogram as well as exercise treadmill testing. The echocardiogram showed an ejection fraction of 55% to 60% and a pulmonary pressure of 26 mmHg. His cardiopulmonary exercise testing showed an excellent VO2 max of 136% of predicted, his preexercise spirometry was normal at that time, per report. His oxygen saturation decreased from 98% to 91% at peak exercise.  He notes that he developed breathing issues about a year ago, they came on with minimal to no activity, he exercises regularly, he ran half marathon 4 months ago, but has been busy with work since that time so has not really been exercising since that time. He has gained 10 lbs. He was having trouble breathing even before the marathon. He had no trouble during the marathon.  He appears stressed today because of his breathing episodes. He notes that "my body says 'take a deep breath'" Sometimes he feels that he can not getting a deep breath in and then panics and tries to take another breath, and this process continues. If he focuses on calming down then it will go away.  He has never smoked, he has 7 dogs, 3 sleep in bed with him. He has been tested for allergies in the past which was not positive for dogs. He does have constant sinus drainage, he takes antihistamine but it makes him sleepy.  He is using his dental device on most nights which he uses 4 to 5 nights per week, he does not use it sometimes due to stickyness in the morning.  He does not use a nasal spray.     PMHX:   Past Medical History  Diagnosis Date  . Chicken pox   . GERD (gastroesophageal reflux disease)   . Allergy    Surgical Hx:  Past Surgical History  Procedure Laterality Date  . Anterior  cruciate ligament repair Left   . Knee arthroscopy w/ medial collateral ligament (mcl) repair Left   . Knee cartilage surgery Bilateral   . Neck surgery  2006    C6, C7  . Shoulder surgery Left     and arm   Family Hx:  Family History  Problem Relation Age of Onset  . Hyperlipidemia Father   . Hypertension Father   . Diabetes Father   . Arthritis Maternal Grandfather   . Cancer Maternal Grandfather     Prostate  . Diabetes Paternal Grandfather    Social Hx:   Social History  Substance Use Topics  . Smoking status: Never Smoker   . Smokeless tobacco: Never Used  . Alcohol Use: 6.0 oz/week    8 Glasses of wine, 2 Standard drinks or equivalent per week   Medication:   Current Outpatient Rx  Name  Route  Sig  Dispense  Refill  . aspirin 81 MG tablet   Oral   Take 81 mg by mouth daily.         . Misc Natural Products (GLUCOSAMINE CHONDROITIN ADV PO)   Oral   Take 1 capsule by mouth 2 (two) times daily.         . Multiple Vitamins-Minerals (MULTIVITAMIN WITH MINERALS) tablet   Oral   Take 1 tablet by mouth daily.         Marland Kitchen testosterone cypionate (DEPOTESTOTERONE CYPIONATE) 200 MG/ML injection   Intramuscular   Inject 100 mg into the muscle once a week.              Allergies:  Review of patient's allergies indicates no known allergies.  Review of Systems: Gen:  Denies  fever, sweats, chills HEENT: Denies blurred vision, double vision. bleeds, sore throat Cvc:  No dizziness, chest pain. Resp:   Denies cough or sputum porduction, shortness of breath Gi: Denies swallowing difficulty, stomach pain. Gu:  Denies bladder incontinence, burning urine Ext:   No Joint pain, stiffness. Skin: No skin rash,  hives Endoc:  No polyuria, polydipsia. Psych: No depression, insomnia. Other:  All other systems were reviewed with the patient and were negative other that what is mentioned in the HPI.   Physical Examination:   VS: There were no vitals taken for this  visit.  General Appearance: No distress  Neuro:without focal findings,  speech normal,  HEENT: PERRLA, EOM intact.   Pulmonary: normal breath sounds, No wheezing.  CardiovascularNormal S1,S2.  No m/r/g.   Abdomen: Benign, Soft, non-tender. Renal:  No costovertebral tenderness  GU:  No performed at this time. Endoc: No evident thyromegaly, no signs of acromegaly. Skin:   warm, no rashes, no ecchymosis  Extremities: normal, no cyanosis, clubbing.  Other findings:    LABORATORY  PANEL:   CBC No results for input(s): WBC, HGB, HCT, PLT in the last 168 hours. ------------------------------------------------------------------------------------------------------------------  Chemistries  No results for input(s): NA, K, CL, CO2, GLUCOSE, BUN, CREATININE, CALCIUM, MG, AST, ALT, ALKPHOS, BILITOT in the last 168 hours.  Invalid input(s): GFRCGP ------------------------------------------------------------------------------------------------------------------  Cardiac Enzymes No results for input(s): TROPONINI in the last 168 hours. ------------------------------------------------------------  RADIOLOGY:  No results found.     Orders for this visit: No orders of the defined types were placed in this encounter.     Thank  you for the consultation and for allowing [facility] to assist in the care of your patient. Our recommendations are noted above.  Please contact us if we can be of further service.   Marda Stalker, MD.  Board Certified in Internal Medicine, Pulmonary Medicine, [place], and Sleep Medicine.  [facility] Office Number: [phone]  Patricia Pesa, M.D.  Vilinda Boehringer, M.D.  Cheral Marker, M.D

## 2015-05-04 ENCOUNTER — Ambulatory Visit: Payer: BLUE CROSS/BLUE SHIELD | Admitting: Internal Medicine

## 2015-06-05 ENCOUNTER — Other Ambulatory Visit: Payer: Self-pay | Admitting: Internal Medicine

## 2015-06-05 ENCOUNTER — Encounter: Payer: Self-pay | Admitting: Internal Medicine

## 2015-06-05 ENCOUNTER — Ambulatory Visit (INDEPENDENT_AMBULATORY_CARE_PROVIDER_SITE_OTHER): Payer: BLUE CROSS/BLUE SHIELD | Admitting: Internal Medicine

## 2015-06-05 VITALS — BP 148/98 | HR 74 | Ht 70.0 in | Wt 226.0 lb

## 2015-06-05 DIAGNOSIS — R0602 Shortness of breath: Secondary | ICD-10-CM

## 2015-06-05 DIAGNOSIS — R0600 Dyspnea, unspecified: Secondary | ICD-10-CM | POA: Diagnosis not present

## 2015-06-05 LAB — PULMONARY FUNCTION TEST
DL/VA % PRED: 101 %
DL/VA: 4.72 ml/min/mmHg/L
DLCO unc % pred: 113 %
DLCO unc: 36.65 ml/min/mmHg
FEF 25-75 PRE: 3.71 L/s
FEF 25-75 Post: 3.84 L/sec
FEF2575-%Change-Post: 3 %
FEF2575-%PRED-PRE: 107 %
FEF2575-%Pred-Post: 111 %
FEV1-%Change-Post: 0 %
FEV1-%Pred-Post: 103 %
FEV1-%Pred-Pre: 102 %
FEV1-PRE: 4 L
FEV1-Post: 4.03 L
FEV1FVC-%Change-Post: 0 %
FEV1FVC-%Pred-Pre: 102 %
FEV6-%CHANGE-POST: 0 %
FEV6-%PRED-PRE: 103 %
FEV6-%Pred-Post: 103 %
FEV6-POST: 5.02 L
FEV6-Pre: 5.01 L
FEV6FVC-%CHANGE-POST: 0 %
FEV6FVC-%PRED-POST: 104 %
FEV6FVC-%Pred-Pre: 104 %
FVC-%CHANGE-POST: 0 %
FVC-%Pred-Post: 99 %
FVC-%Pred-Pre: 99 %
FVC-Post: 5.02 L
FVC-Pre: 5.01 L
POST FEV6/FVC RATIO: 100 %
PRE FEV1/FVC RATIO: 80 %
Post FEV1/FVC ratio: 80 %
Pre FEV6/FVC Ratio: 100 %

## 2015-06-05 NOTE — Progress Notes (Signed)
PFT performed today. 

## 2015-06-05 NOTE — Patient Instructions (Signed)
CT chest with contrast.  Will call with results.  --Follow up as needed.  --Weight loss would be beneficial.
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
## 2015-06-06 ENCOUNTER — Telehealth: Payer: Self-pay | Admitting: Internal Medicine

## 2015-06-06 NOTE — Telephone Encounter (Signed)
Called stating order needs to be stat due it being PE protocol. Order is already stat. Nothing further needed.

## 2015-06-07 ENCOUNTER — Ambulatory Visit
Admission: RE | Admit: 2015-06-07 | Discharge: 2015-06-07 | Disposition: A | Discharge status: Home / Self Care | Payer: BLUE CROSS/BLUE SHIELD | Source: Ambulatory Visit | Attending: Internal Medicine | Admitting: Internal Medicine

## 2015-06-07 DIAGNOSIS — R918 Other nonspecific abnormal finding of lung field: Secondary | ICD-10-CM | POA: Diagnosis not present

## 2015-06-07 DIAGNOSIS — R0600 Dyspnea, unspecified: Secondary | ICD-10-CM

## 2015-06-07 MED ORDER — IOHEXOL 350 MG/ML SOLN
100.0000 mL | Freq: Once | INTRAVENOUS | Status: AC | PRN
  Administered 2015-06-07: 100 mL via INTRAVENOUS

## 2015-06-14 ENCOUNTER — Telehealth: Payer: Self-pay | Admitting: *Deleted

## 2015-06-14 DIAGNOSIS — R0602 Shortness of breath: Secondary | ICD-10-CM

## 2015-06-14 NOTE — Telephone Encounter (Signed)
-----   Message from Laverle Hobby, MD sent at 06/09/2015  4:42 PM EDT ----- Discussed with patient. Need to schedule CT chest without contrast in 6 months and follow up after that.

## 2015-06-14 NOTE — Progress Notes (Signed)
Quick Note:  lmtcb for pt. ______ 

## 2015-11-24 ENCOUNTER — Encounter: Payer: Self-pay | Admitting: Internal Medicine

## 2015-11-24 ENCOUNTER — Ambulatory Visit (INDEPENDENT_AMBULATORY_CARE_PROVIDER_SITE_OTHER): Payer: BLUE CROSS/BLUE SHIELD | Admitting: Internal Medicine

## 2015-11-24 VITALS — BP 122/82 | HR 70 | Temp 99.0°F | Wt 224.0 lb

## 2015-11-24 DIAGNOSIS — J302 Other seasonal allergic rhinitis: Secondary | ICD-10-CM | POA: Diagnosis not present

## 2015-11-24 MED ORDER — PREDNISONE 10 MG PO TABS: ORAL_TABLET | ORAL | Status: DC

## 2015-11-24 MED ORDER — HYDROCODONE-HOMATROPINE 5-1.5 MG/5ML PO SYRP: 5.0000 mL | ORAL_SOLUTION | Freq: Three times a day (TID) | ORAL | Status: DC | PRN

## 2015-11-24 NOTE — Progress Notes (Signed)
Pre visit review using our clinic review tool, if applicable. No additional management support is needed unless otherwise documented below in the visit note. 

## 2015-11-24 NOTE — Patient Instructions (Signed)
Allergic Rhinitis Allergic rhinitis is when the mucous membranes in the nose respond to allergens. Allergens are particles in the air that cause your body to have an allergic reaction. This causes you to release allergic antibodies. Through a chain of events, these eventually cause you to release histamine into the blood stream. Although meant to protect the body, it is this release of histamine that causes your discomfort, such as frequent sneezing, congestion, and an itchy, runny nose.  CAUSES Seasonal allergic rhinitis (hay fever) is caused by pollen allergens that may come from grasses, trees, and weeds. Year-round allergic rhinitis (perennial allergic rhinitis) is caused by allergens such as house dust mites, pet dander, and mold spores. SYMPTOMS  Nasal stuffiness (congestion).  Itchy, runny nose with sneezing and tearing of the eyes. DIAGNOSIS Your health care provider can help you determine the allergen or allergens that trigger your symptoms. If you and your health care provider are unable to determine the allergen, skin or blood testing may be used. Your health care provider will diagnose your condition after taking your health history and performing a physical exam. Your health care provider may assess you for other related conditions, such as asthma, pink eye, or an ear infection. TREATMENT Allergic rhinitis does not have a cure, but it can be controlled by:  Medicines that block allergy symptoms. These may include allergy shots, nasal sprays, and oral antihistamines.  Avoiding the allergen. Hay fever may often be treated with antihistamines in pill or nasal spray forms. Antihistamines block the effects of histamine. There are over-the-counter medicines that may help with nasal congestion and swelling around the eyes. Check with your health care provider before taking or giving this medicine. If avoiding the allergen or the medicine prescribed do not work, there are many new medicines  your health care provider can prescribe. Stronger medicine may be used if initial measures are ineffective. Desensitizing injections can be used if medicine and avoidance does not work. Desensitization is when a patient is given ongoing shots until the body becomes less sensitive to the allergen. Make sure you follow up with your health care provider if problems continue. HOME CARE INSTRUCTIONS It is not possible to completely avoid allergens, but you can reduce your symptoms by taking steps to limit your exposure to them. It helps to know exactly what you are allergic to so that you can avoid your specific triggers. SEEK MEDICAL CARE IF:  You have a fever.  You develop a cough that does not stop easily (persistent).  You have shortness of breath.  You start wheezing.  Symptoms interfere with normal daily activities.   This information is not intended to replace advice given to you by your health care provider. Make sure you discuss any questions you have with your health care provider.   Document Released: 05/07/2001 Document Revised: 09/02/2014 Document Reviewed: 04/19/2013 Elsevier Interactive Patient Education 2016 Elsevier Inc.  

## 2015-11-26 ENCOUNTER — Encounter: Payer: Self-pay | Admitting: Internal Medicine

## 2015-11-26 NOTE — Progress Notes (Signed)
HPI  Pt presents to the clinic today with c/o headache, nasal congestion and cough. He reports this has been intermittent over the last 2 months. He is blowing clear mucous out of his nose. The cough is non productive. He denies shortness of breath, but does feel like he is not able to take a full, complete deep breath (he has seen cardiology and pulmonolgy about this). He has not tried anything OTC. He has a history of seasonal allergies. He has not had sick contacts that he is aware of.  Review of Systems      Past Medical History  Diagnosis Date  . Chicken pox   . GERD (gastroesophageal reflux disease)   . Allergy     Family History  Problem Relation Age of Onset  . Hyperlipidemia Father   . Hypertension Father   . Diabetes Father   . Arthritis Maternal Grandfather   . Cancer Maternal Grandfather     Prostate  . Diabetes Paternal Grandfather     Social History   Social History  . Marital Status: Married    Spouse Name: N/A  . Number of Children: N/A  . Years of Education: N/A   Occupational History  . IT     Social History Main Topics  . Smoking status: Never Smoker   . Smokeless tobacco: Never Used  . Alcohol Use: 6.0 oz/week    8 Glasses of wine, 2 Standard drinks or equivalent per week  . Drug Use: No  . Sexual Activity: Yes   Other Topics Concern  . Not on file   Social History Narrative    No Known Allergies   Constitutional: Positive headache. Denies fatigue, fever or abrupt weight changes.  HEENT:  Positive nasal congestion. Denies eye redness, eye pain, pressure behind the eyes, facial pain, ear pain, ringing in the ears, wax buildup, runny nose or sore throat. Respiratory: Positive cough. Denies difficulty breathing or shortness of breath.  Cardiovascular: Denies chest pain, chest tightness, palpitations or swelling in the hands or feet.   No other specific complaints in a complete review of systems (except as listed in HPI above).  Objective:    BP 122/82 mmHg  Pulse 70  Temp(Src) 99 F (37.2 C) (Oral)  Wt 224 lb (101.606 kg)  SpO2 97% Wt Readings from Last 3 Encounters:  11/24/15 224 lb (101.606 kg)  06/05/15 226 lb (102.513 kg)  04/18/15 224 lb (101.606 kg)     General: Appears his stated age, well developed, well nourished in NAD. HEENT: Head: normal shape and size, no sinus tenderness noted; Eyes: sclera white, no icterus, conjunctiva pink; Ears: Tm's gray and intact, normal light reflex; Nose: mucosa pink and moist, septum midline; Throat/Mouth: + PND. Teeth present, mucosa pink and moist, no exudate noted, no lesions or ulcerations noted.  Neck: No cervical lymphadenopathy.  Cardiovascular: Normal rate and rhythm. S1,S2 noted.  No murmur, rubs or gallops noted.  Pulmonary/Chest: Normal effort and positive vesicular breath sounds. No respiratory distress. No wheezes, rales or ronchi noted.      Assessment & Plan:   Allergic Rhinitis:  Get some rest and drink plenty of water Start Flonase and Zyrtec OTC eRx for Pred Taper x 6 days Rx for Hycodan cough syrup  RTC as needed or if symptoms persist.

## 2015-12-14 ENCOUNTER — Ambulatory Visit: Payer: BLUE CROSS/BLUE SHIELD

## 2016-07-17 DIAGNOSIS — Z202 Contact with and (suspected) exposure to infections with a predominantly sexual mode of transmission: Secondary | ICD-10-CM | POA: Diagnosis not present

## 2016-07-22 ENCOUNTER — Ambulatory Visit (INDEPENDENT_AMBULATORY_CARE_PROVIDER_SITE_OTHER): Payer: BLUE CROSS/BLUE SHIELD | Admitting: Internal Medicine

## 2016-07-22 ENCOUNTER — Encounter: Payer: Self-pay | Admitting: Internal Medicine

## 2016-07-22 VITALS — BP 128/82 | HR 71 | Temp 98.8°F | Wt 219.0 lb

## 2016-07-22 DIAGNOSIS — B009 Herpesviral infection, unspecified: Secondary | ICD-10-CM | POA: Diagnosis not present

## 2016-07-22 MED ORDER — VALACYCLOVIR HCL 500 MG PO TABS: 500.0000 mg | ORAL_TABLET | Freq: Every day | ORAL | 3 refills | Status: DC
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
## 2016-07-22 NOTE — Patient Instructions (Signed)

## 2016-07-29 DIAGNOSIS — J069 Acute upper respiratory infection, unspecified: Secondary | ICD-10-CM | POA: Diagnosis not present

## 2016-07-31 ENCOUNTER — Encounter: Payer: Self-pay | Admitting: Internal Medicine

## 2016-08-22 ENCOUNTER — Ambulatory Visit (INDEPENDENT_AMBULATORY_CARE_PROVIDER_SITE_OTHER): Payer: BLUE CROSS/BLUE SHIELD | Admitting: Internal Medicine

## 2016-08-22 ENCOUNTER — Encounter: Payer: Self-pay | Admitting: Internal Medicine

## 2016-08-22 VITALS — BP 126/70 | HR 77 | Temp 98.1°F | Wt 224.0 lb

## 2016-08-22 DIAGNOSIS — B9789 Other viral agents as the cause of diseases classified elsewhere: Secondary | ICD-10-CM

## 2016-08-22 DIAGNOSIS — J329 Chronic sinusitis, unspecified: Secondary | ICD-10-CM

## 2016-08-22 MED ORDER — PREDNISONE 10 MG PO TABS: ORAL_TABLET | ORAL | 0 refills | Status: DC

## 2016-08-22 NOTE — Patient Instructions (Signed)

## 2016-08-22 NOTE — Progress Notes (Signed)
Subjective:    Patient ID: Troy Perry, male    DOB: Mar 03, 1966, 50 y.o.   MRN: [id number]  HPI  Pt presents to the clinic today to follow up of URI . He reports he went to [facility] the week before Thanksgiving, and was prescribed antibiotics. He went [facility] with c/o ongoing cough and congestion symptoms on 12/4. They prescribed him Doxycycline x 10 days along with some cough syrup. He did note some improvement with the 2nd antibiotic, but reports symptoms returned 1 week later. He is c/o headache, facial pain and pressure, nasal congestion and sore throat. He is blowing clear, yellow mucous out of his nose. He denies difficulty swallowing. He has a dry, non productive cough. He denies fever, chills or body aches. He has tried Mucinex, Therapist, sports with minimal relief. He has a history of seasonal allergies. He has not had sick contacts that he is aware of.  Review of Systems      Past Medical History:  Diagnosis Date  . Allergy   . Chicken pox   . GERD (gastroesophageal reflux disease)     Current Outpatient Prescriptions  Medication Sig Dispense Refill  . aspirin 81 MG tablet Take 81 mg by mouth daily.    . brompheniramine-pseudoephedrine-DM 30-2-10 MG/5ML syrup Take by mouth.    . doxycycline (VIBRA-TABS) 100 MG tablet     . Misc Natural Products (GLUCOSAMINE CHONDROITIN ADV PO) Take 1 capsule by mouth 2 (two) times daily.    . Multiple Vitamins-Minerals (MULTIVITAMIN WITH MINERALS) tablet Take 1 tablet by mouth daily.    Marland Kitchen triamcinolone (NASACORT ALLERGY 24HR) 55 MCG/ACT AERO nasal inhaler Place 2 sprays into the nose daily.    . valACYclovir (VALTREX) 500 MG tablet Take 1 tablet (500 mg total) by mouth daily. 90 tablet 3   No current facility-administered medications for this visit.     No Known Allergies  Family History  Problem Relation Age of Onset  . Hyperlipidemia Father   . Hypertension Father   . Diabetes Father   . Arthritis Maternal Grandfather     . Cancer Maternal Grandfather     Prostate  . Diabetes Paternal Grandfather     Social History   Social History  . Marital status: Married    Spouse name: N/A  . Number of children: N/A  . Years of education: N/A   Occupational History  . IT     Social History Main Topics  . Smoking status: Never Smoker  . Smokeless tobacco: Never Used  . Alcohol use 6.0 oz/week    8 Glasses of wine, 2 Standard drinks or equivalent per week  . Drug use: No  . Sexual activity: Yes   Other Topics Concern  . Not on file   Social History Narrative  . No narrative on file     Constitutional: Pt reports headache and fatigue. Denies fever, malaise, or abrupt weight changes.  HEENT: Pt reports nasal congestion and sore throat. Denies eye pain, eye redness, ear pain, ringing in the ears, wax buildup, runny nose, bloody nose. Respiratory: Pt reports cough. Denies difficulty breathing, shortness of breath, or sputum production.   Cardiovascular: Denies chest pain, chest tightness, palpitations or swelling in the hands or feet.   No other specific complaints in a complete review of systems (except as listed in HPI above).  Objective:   Physical Exam  BP 126/70   Pulse 77   Temp 98.1 F (36.7 C) (Oral)  Wt 224 lb (101.6 kg)   SpO2 98%   BMI 32.14 kg/m  Wt Readings from Last 3 Encounters:  08/22/16 224 lb (101.6 kg)  07/22/16 219 lb (99.3 kg)  11/24/15 224 lb (101.6 kg)    General: Appears his stated age, in NAD. HEENT: Head: normal shape and size, no sinus tenderness noted; Eyes: sclera white, no icterus, conjunctiva pink; Ears: Tm's gray and intact, normal light reflex, + serous effusion bilaterally; Nose: mucosa boggy and moist, septum midline; Throat/Mouth: Teeth present, mucosa pink and moist, + PND, no exudate, lesions or ulcerations noted.  Neck:  No adenopathy noted.  Cardiovascular: Normal rate and rhythm. S1,S2 noted.   Pulmonary/Chest: Normal effort and positive vesicular  breath sounds. No respiratory distress. No wheezes, rales or ronchi noted.      Assessment & Plan:   Viral Sinusitis:  No indication for additional abx Stop Nasocort Start Zyrtec OTC eRx for Pred Taper x 6 days Once done with Prednisone, restart Nasocort  If symptoms persist, consider ENT referral  RTC as needed or if symptoms persist or worsen BAITY, REGINA, NP

## 2016-10-08 DIAGNOSIS — R7301 Impaired fasting glucose: Secondary | ICD-10-CM | POA: Diagnosis not present

## 2016-10-08 DIAGNOSIS — E781 Pure hyperglyceridemia: Secondary | ICD-10-CM | POA: Diagnosis not present

## 2016-10-08 DIAGNOSIS — E291 Testicular hypofunction: Secondary | ICD-10-CM | POA: Diagnosis not present

## 2016-10-10 DIAGNOSIS — E781 Pure hyperglyceridemia: Secondary | ICD-10-CM | POA: Diagnosis not present

## 2016-10-10 DIAGNOSIS — R7301 Impaired fasting glucose: Secondary | ICD-10-CM | POA: Diagnosis not present

## 2016-10-10 DIAGNOSIS — E291 Testicular hypofunction: Secondary | ICD-10-CM | POA: Diagnosis not present

## 2017-04-03 DIAGNOSIS — E781 Pure hyperglyceridemia: Secondary | ICD-10-CM | POA: Diagnosis not present

## 2017-04-03 DIAGNOSIS — R7301 Impaired fasting glucose: Secondary | ICD-10-CM | POA: Diagnosis not present

## 2017-04-03 DIAGNOSIS — E291 Testicular hypofunction: Secondary | ICD-10-CM | POA: Diagnosis not present

## 2017-04-10 DIAGNOSIS — R6889 Other general symptoms and signs: Secondary | ICD-10-CM | POA: Diagnosis not present

## 2017-04-10 DIAGNOSIS — R7301 Impaired fasting glucose: Secondary | ICD-10-CM | POA: Diagnosis not present

## 2017-04-10 DIAGNOSIS — E781 Pure hyperglyceridemia: Secondary | ICD-10-CM | POA: Diagnosis not present

## 2017-04-10 DIAGNOSIS — E291 Testicular hypofunction: Secondary | ICD-10-CM | POA: Diagnosis not present

## 2017-08-30 ENCOUNTER — Other Ambulatory Visit: Payer: Self-pay | Admitting: Internal Medicine

## 2017-08-30 DIAGNOSIS — B009 Herpesviral infection, unspecified: Secondary | ICD-10-CM

## 2017-09-01 NOTE — Telephone Encounter (Signed)
Last Rx 06/2016 #90 3r. Last OV 07/2016 with no upcoming appts. pls advise

## 2017-09-01 NOTE — Telephone Encounter (Signed)
Will give 30 day supply. No refills unless patient schedules annual exam.

## 2017-09-01 NOTE — Telephone Encounter (Signed)
Lm on pts vm and advised CPE required for any additional refills

## 2017-10-07 DIAGNOSIS — E291 Testicular hypofunction: Secondary | ICD-10-CM | POA: Diagnosis not present

## 2017-10-07 DIAGNOSIS — E781 Pure hyperglyceridemia: Secondary | ICD-10-CM | POA: Diagnosis not present

## 2017-10-07 DIAGNOSIS — R7301 Impaired fasting glucose: Secondary | ICD-10-CM | POA: Diagnosis not present

## 2017-10-14 DIAGNOSIS — R7301 Impaired fasting glucose: Secondary | ICD-10-CM | POA: Diagnosis not present

## 2017-10-14 DIAGNOSIS — E781 Pure hyperglyceridemia: Secondary | ICD-10-CM | POA: Diagnosis not present

## 2017-10-14 DIAGNOSIS — E291 Testicular hypofunction: Secondary | ICD-10-CM | POA: Diagnosis not present

## 2017-10-15 ENCOUNTER — Other Ambulatory Visit: Payer: Self-pay

## 2017-10-21 ENCOUNTER — Ambulatory Visit (INDEPENDENT_AMBULATORY_CARE_PROVIDER_SITE_OTHER): Payer: BLUE CROSS/BLUE SHIELD | Admitting: Internal Medicine

## 2017-10-21 ENCOUNTER — Encounter: Payer: Self-pay | Admitting: Internal Medicine

## 2017-10-21 VITALS — BP 130/88 | HR 66 | Temp 99.1°F | Ht 68.75 in | Wt 200.0 lb

## 2017-10-21 DIAGNOSIS — Z0000 Encounter for general adult medical examination without abnormal findings: Secondary | ICD-10-CM

## 2017-10-21 DIAGNOSIS — G4733 Obstructive sleep apnea (adult) (pediatric): Secondary | ICD-10-CM | POA: Diagnosis not present

## 2017-10-21 NOTE — Assessment & Plan Note (Signed)
Continue dental device

## 2017-10-21 NOTE — Progress Notes (Signed)
Subjective:    Patient ID: Troy Perry, male    DOB: [birth year], 52 y.o.   MRN: [id number]  HPI  Pt presents to he clinic today for his annual exam.   OSA: He uses a dental device.  He does feel rested when he wakes up.  Flu: never Tetanus: ? 2011 PSA Screening: never Colon Screening: never Vision Screening: annual exam Dentist: biannually  Diet: He does eat meat. He consumes fruits and veggies daily. He does not eat fried food. He drinks only water. Exercise: Cycling 5 days per week  Review of Systems  Past Medical History:  Diagnosis Date  . Allergy   . Chicken pox   . GERD (gastroesophageal reflux disease)     Current Outpatient Medications  Medication Sig Dispense Refill  . aspirin 81 MG tablet Take 81 mg by mouth daily.    . Misc Natural Products (GLUCOSAMINE CHONDROITIN ADV PO) Take 1 capsule by mouth 2 (two) times daily.    . Multiple Vitamins-Minerals (MULTIVITAMIN WITH MINERALS) tablet Take 1 tablet by mouth daily.    . predniSONE (DELTASONE) 10 MG tablet Take 6 tabs day 1, 5 tabs day 2, 4 tabs day 3, 3 tabs day 4, 2 tabs day 5, 1 tab day 6 21 tablet 0  . triamcinolone (NASACORT ALLERGY 24HR) 55 MCG/ACT AERO nasal inhaler Place 2 sprays into the nose daily.    . valACYclovir (VALTREX) 500 MG tablet TAKE ONE TABLET EVERY DAY 30 tablet 0   No current facility-administered medications for this visit.     No Known Allergies  Family History  Problem Relation Age of Onset  . Hyperlipidemia Father   . Hypertension Father   . Diabetes Father   . Arthritis Maternal Grandfather   . Cancer Maternal Grandfather        Prostate  . Diabetes Paternal Grandfather     Social History   Socioeconomic History  . Marital status: Married    Spouse name: Not on file  . Number of children: Not on file  . Years of education: Not on file  . Highest education level: Not on file  Social Needs  . Financial resource strain: Not on file  . Food insecurity - worry: Not on  file  . Food insecurity - inability: Not on file  . Transportation needs - medical: Not on file  . Transportation needs - non-medical: Not on file  Occupational History  . Occupation: IT   Tobacco Use  . Smoking status: Never Smoker  . Smokeless tobacco: Never Used  Substance and Sexual Activity  . Alcohol use: Yes    Alcohol/week: 6.0 oz    Types: 8 Glasses of wine, 2 Standard drinks or equivalent per week  . Drug use: No  . Sexual activity: Yes  Other Topics Concern  . Not on file  Social History Narrative  . Not on file     Constitutional: Denies fever, malaise, fatigue, headache or abrupt weight changes.  HEENT: Denies eye pain, eye redness, ear pain, ringing in the ears, wax buildup, runny nose, nasal congestion, bloody nose, or sore throat. Respiratory: Denies difficulty breathing, shortness of breath, cough or sputum production.   Cardiovascular: Denies chest pain, chest tightness, palpitations or swelling in the hands or feet.  Gastrointestinal: Denies abdominal pain, bloating, constipation, diarrhea or blood in the stool.  GU: Denies urgency, frequency, pain with urination, burning sensation, blood in urine, odor or discharge. Musculoskeletal: Denies decrease in range of motion, difficulty with  gait, muscle pain or joint pain and swelling.  Skin: Denies redness, rashes, lesions or ulcercations.  Neurological: Denies dizziness, difficulty with memory, difficulty with speech or problems with balance and coordination.  Psych: Denies anxiety, depression, SI/HI.  No other specific complaints in a complete review of systems (except as listed in HPI above).     Objective:   Physical Exam   BP 130/88   Pulse 66   Temp 99.1 F (37.3 C) (Oral)   Ht 5' 8.75" (1.746 m)   Wt 200 lb (90.7 kg)   SpO2 98%   BMI 29.75 kg/m  Wt Readings from Last 3 Encounters:  10/21/17 200 lb (90.7 kg)  08/22/16 224 lb (101.6 kg)  07/22/16 219 lb (99.3 kg)    General: Appears his stated  age, well developed, well nourished in NAD. Skin: Warm, dry and intact.  HEENT: Head: normal shape and size; Eyes: sclera white, no icterus, conjunctiva pink, PERRLA and EOMs intact; Ears: Tm's gray and intact, normal light reflex;  Throat/Mouth: Teeth present, mucosa pink and moist, no exudate, lesions or ulcerations noted.  Neck:  Neck supple, trachea midline. No masses, lumps or thyromegaly present.  Cardiovascular: Normal rate and rhythm. S1,S2 noted.  No murmur, rubs or gallops noted. No JVD or BLE edema. No carotid bruits noted. Pulmonary/Chest: Normal effort and positive vesicular breath sounds. No respiratory distress. No wheezes, rales or ronchi noted.  Abdomen: Soft and nontender. Normal bowel sounds. No distention or masses noted. Liver, spleen and kidneys non palpable. Musculoskeletal: Strength 5/5 BUE/BLE. No difficulty with gait.  Neurological: Alert and oriented. Cranial nerves II-XII grossly intact. Coordination normal.  Psychiatric: Mood and affect normal. Behavior is normal. Judgment and thought content normal.   EKG:  BMET No results found for: NA, K, CL, CO2, GLUCOSE, BUN, CREATININE, CALCIUM, GFRNONAA, GFRAA  Lipid Panel     Component Value Date/Time   HDL 49 04/25/2008   LDLCALC 59 04/25/2008    CBC No results found for: WBC, RBC, HGB, HCT, PLT, MCV, MCH, MCHC, RDW, LYMPHSABS, MONOABS, EOSABS, BASOSABS  Hgb A1C No results found for: HGBA1C         Assessment & Plan:   Preventative Health Maintenance:  He declines flu shot today Tetanus UTD He declines PSA today He declines colonoscopy or Cologuard today Encouraged him to consume a balanced diet and exercise regimen Advised him to see an eye doctor and dentist annually Labs from [place] reviewed  RTC in 1 year, sooner if needed [name], NP

## 2017-10-21 NOTE — Patient Instructions (Signed)

## 2017-10-31 ENCOUNTER — Other Ambulatory Visit: Payer: Self-pay | Admitting: Internal Medicine

## 2017-10-31 DIAGNOSIS — B009 Herpesviral infection, unspecified: Secondary | ICD-10-CM

## 2017-11-07 ENCOUNTER — Ambulatory Visit: Payer: Self-pay | Admitting: Urology

## 2017-12-11 ENCOUNTER — Other Ambulatory Visit: Payer: Self-pay | Admitting: Internal Medicine

## 2017-12-11 DIAGNOSIS — B009 Herpesviral infection, unspecified: Secondary | ICD-10-CM

## 2018-04-24 DIAGNOSIS — H524 Presbyopia: Secondary | ICD-10-CM | POA: Diagnosis not present

## 2018-05-11 ENCOUNTER — Encounter: Payer: Self-pay | Admitting: Internal Medicine

## 2018-05-11 ENCOUNTER — Ambulatory Visit (INDEPENDENT_AMBULATORY_CARE_PROVIDER_SITE_OTHER): Payer: BLUE CROSS/BLUE SHIELD | Admitting: Internal Medicine

## 2018-05-11 VITALS — BP 108/74 | HR 71 | Temp 98.7°F | Ht 68.75 in | Wt 220.0 lb

## 2018-05-11 DIAGNOSIS — K1120 Sialoadenitis, unspecified: Secondary | ICD-10-CM | POA: Diagnosis not present

## 2018-05-11 MED ORDER — AMOXICILLIN-POT CLAVULANATE 875-125 MG PO TABS: 1.0000 | ORAL_TABLET | Freq: Two times a day (BID) | ORAL | 0 refills | Status: DC

## 2018-05-11 MED ORDER — CEFTRIAXONE SODIUM 1 G IJ SOLR
1.0000 g | Freq: Once | INTRAMUSCULAR | Status: AC
  Administered 2018-05-11: 1 g via INTRAMUSCULAR

## 2018-05-11 NOTE — Assessment & Plan Note (Signed)
Indurated and worsening Not toxic and appears well otherwise Will give rocephin, then augmentin ENT evaluation (hopefully tomorrow) --to be sure more aggressive Rx not needed

## 2018-05-11 NOTE — Progress Notes (Signed)
Subjective:    Patient ID: Troy Perry, male    DOB: 09-12-1965, 52 y.o.   MRN: [id number]  HPI Here due to left jaw swelling Started very far back behind the jaw--now larger and moving forward Mild ear ache Started 4 days ago---started just being sore (has had to hold off on the dental appliance)  Had been in [place] last week No pain in mouth Fever 3 weeks ago--with cold. None since then  Did try ice---worsened Then warm compress---that helped some Continues on advil  Current Outpatient Medications on File Prior to Visit  Medication Sig Dispense Refill  . aspirin 81 MG tablet Take 81 mg by mouth daily.    . Multiple Vitamins-Minerals (MULTIVITAMIN WITH MINERALS) tablet Take 1 tablet by mouth daily.    . valACYclovir (VALTREX) 500 MG tablet TAKE 1 TABLET DAILY 30 tablet 10   No current facility-administered medications on file prior to visit.     No Known Allergies  Past Medical History:  Diagnosis Date  . Allergy   . Chicken pox   . GERD (gastroesophageal reflux disease)     Past Surgical History:  Procedure Laterality Date  . ANTERIOR CRUCIATE LIGAMENT REPAIR Left   . KNEE ARTHROSCOPY W/ MEDIAL COLLATERAL LIGAMENT (MCL) REPAIR Left   . KNEE CARTILAGE SURGERY Bilateral   . NECK SURGERY  2006   C6, C7  . SHOULDER SURGERY Left    and arm    Family History  Problem Relation Age of Onset  . Hyperlipidemia Father   . Hypertension Father   . Diabetes Father   . Arthritis Maternal Grandfather   . Cancer Maternal Grandfather        Prostate  . Diabetes Paternal Grandfather     Social History   Socioeconomic History  . Marital status: Married    Spouse name: Not on file  . Number of children: Not on file  . Years of education: Not on file  . Highest education level: Not on file  Occupational History  . Occupation: IT   Social Needs  . Financial resource strain: Not on file  . Food insecurity:    Worry: Not on file    Inability: Not on file  .  Transportation needs:    Medical: Not on file    Non-medical: Not on file  Tobacco Use  . Smoking status: Never Smoker  . Smokeless tobacco: Never Used  Substance and Sexual Activity  . Alcohol use: Yes    Alcohol/week: 10.0 standard drinks    Types: 8 Glasses of wine, 2 Standard drinks or equivalent per week    Comment: social--daily  . Drug use: No  . Sexual activity: Yes  Lifestyle  . Physical activity:    Days per week: Not on file    Minutes per session: Not on file  . Stress: Not on file  Relationships  . Social connections:    Talks on phone: Not on file    Gets together: Not on file    Attends religious service: Not on file    Active member of club or organization: Not on file    Attends meetings of clubs or organizations: Not on file    Relationship status: Not on file  . Intimate partner violence:    Fear of current or ex partner: Not on file    Emotionally abused: Not on file    Physically abused: Not on file    Forced sexual activity: Not on file  Other  Topics Concern  . Not on file  Social History Narrative  . Not on file   Review of Systems Some pain with chewing--in the jaw Eating okay if he continues the advil Did have abscessed wisdom tooth in past--- different feeling    Objective:   Physical Exam  HENT:  No sinus tenderness TMs are normal---no tragal tenderness No oral lesions Hard parotid gland with tenderness on left---with surrounding tenderness and slight warmth  Neck: No thyromegaly present.  Lymphadenopathy:    He has no cervical adenopathy.           Assessment & Plan:

## 2018-05-11 NOTE — Addendum Note (Signed)
Addended by: Pilar Grammes on: 05/11/2018 02:50 PM   Modules accepted: Orders

## 2018-05-12 DIAGNOSIS — K115 Sialolithiasis: Secondary | ICD-10-CM | POA: Diagnosis not present

## 2018-05-12 DIAGNOSIS — K1121 Acute sialoadenitis: Secondary | ICD-10-CM | POA: Diagnosis not present

## 2019-02-01 ENCOUNTER — Other Ambulatory Visit: Payer: Self-pay | Admitting: Internal Medicine

## 2019-02-01 DIAGNOSIS — B009 Herpesviral infection, unspecified: Secondary | ICD-10-CM

## 2019-04-16 ENCOUNTER — Ambulatory Visit (INDEPENDENT_AMBULATORY_CARE_PROVIDER_SITE_OTHER): Payer: BC Managed Care – PPO | Admitting: Internal Medicine

## 2019-04-16 ENCOUNTER — Encounter: Payer: Self-pay | Admitting: Internal Medicine

## 2019-04-16 VITALS — Wt 214.0 lb

## 2019-04-16 DIAGNOSIS — Z0000 Encounter for general adult medical examination without abnormal findings: Secondary | ICD-10-CM

## 2019-04-16 DIAGNOSIS — Z125 Encounter for screening for malignant neoplasm of prostate: Secondary | ICD-10-CM | POA: Diagnosis not present

## 2019-04-16 DIAGNOSIS — G4733 Obstructive sleep apnea (adult) (pediatric): Secondary | ICD-10-CM

## 2019-04-16 DIAGNOSIS — B009 Herpesviral infection, unspecified: Secondary | ICD-10-CM

## 2019-04-16 MED ORDER — VALACYCLOVIR HCL 500 MG PO TABS: 500.0000 mg | ORAL_TABLET | Freq: Every day | ORAL | 3 refills | Status: DC

## 2019-04-16 NOTE — Progress Notes (Signed)
Virtual Visit via Video Note  I connected with Troy Perry on 04/16/19 at  3:15 PM EDT by a video enabled telemedicine application and verified that I am speaking with the correct person using two identifiers.  Location: Patient: Home Provider: Office   I discussed the limitations of evaluation and management by telemedicine and the availability of in person appointments. The patient expressed understanding and agreed to proceed.  History of Present Illness:  Pt due for his annual exam.  HSV 2: No recent outbreaks. On Valtrex for suppression. Requesting refill today.  OSA: No issues off CPAP. Sleep study from 01/2014 reviewed.  Flu: never Tetanus: ? 2011 PSA Screening: 09/2017 Colon Screening: never Vision Screening: annually Dentist: biannually  Diet: He does eat meat. He consumes fruits and veggies daily. He does not eat fried foods. He drinks only water. Exercise: Cycling every day    Past Medical History:  Diagnosis Date  . Allergy   . Chicken pox   . GERD (gastroesophageal reflux disease)     Current Outpatient Medications  Medication Sig Dispense Refill  . aspirin 81 MG tablet Take 81 mg by mouth daily.    . Multiple Vitamins-Minerals (MULTIVITAMIN WITH MINERALS) tablet Take 1 tablet by mouth daily.    . valACYclovir (VALTREX) 500 MG tablet Take 1 tablet (500 mg total) by mouth daily. 90 tablet 3   No current facility-administered medications for this visit.     No Known Allergies  Family History  Problem Relation Age of Onset  . Hyperlipidemia Father   . Hypertension Father   . Diabetes Father   . Arthritis Maternal Grandfather   . Cancer Maternal Grandfather        Prostate  . Diabetes Paternal Grandfather     Social History   Socioeconomic History  . Marital status: Married    Spouse name: Not on file  . Number of children: Not on file  . Years of education: Not on file  . Highest education level: Not on file  Occupational History  .  Occupation: IT   Social Needs  . Financial resource strain: Not on file  . Food insecurity    Worry: Not on file    Inability: Not on file  . Transportation needs    Medical: Not on file    Non-medical: Not on file  Tobacco Use  . Smoking status: Never Smoker  . Smokeless tobacco: Never Used  Substance and Sexual Activity  . Alcohol use: Yes    Alcohol/week: 10.0 standard drinks    Types: 8 Glasses of wine, 2 Standard drinks or equivalent per week    Comment: social--daily  . Drug use: No  . Sexual activity: Yes  Lifestyle  . Physical activity    Days per week: Not on file    Minutes per session: Not on file  . Stress: Not on file  Relationships  . Social Herbalist on phone: Not on file    Gets together: Not on file    Attends religious service: Not on file    Active member of club or organization: Not on file    Attends meetings of clubs or organizations: Not on file    Relationship status: Not on file  . Intimate partner violence    Fear of current or ex partner: Not on file    Emotionally abused: Not on file    Physically abused: Not on file    Forced sexual activity: Not on file  Other Topics Concern  . Not on file  Social History Narrative  . Not on file     Constitutional: Denies fever, malaise, fatigue, headache or abrupt weight changes.  HEENT: Denies eye pain, eye redness, ear pain, ringing in the ears, wax buildup, runny nose, nasal congestion, bloody nose, or sore throat. Respiratory: Denies difficulty breathing, shortness of breath, cough or sputum production.   Cardiovascular: Denies chest pain, chest tightness, palpitations or swelling in the hands or feet.  Gastrointestinal: Denies abdominal pain, bloating, constipation, diarrhea or blood in the stool.  GU: Denies urgency, frequency, pain with urination, burning sensation, blood in urine, odor or discharge. Musculoskeletal: Pt reports intermittent knee pain. Denies decrease in range of  motion, difficulty with gait, muscle pain or joint swelling.  Skin: Denies redness, rashes, lesions or ulcercations.  Neurological: Denies dizziness, difficulty with memory, difficulty with speech or problems with balance and coordination.  Psych: Denies anxiety, depression, SI/HI.  No other specific complaints in a complete review of systems (except as listed in HPI above).  Observations/Objective:  Wt 214 lb (97.1 kg)   BMI 31.83 kg/m  Wt Readings from Last 3 Encounters:  04/16/19 214 lb (97.1 kg)  05/11/18 220 lb (99.8 kg)  10/21/17 200 lb (90.7 kg)    General: Appears his stated age, well developed, well nourished in NAD. Skin: Warm, dry and intact. No rashesnoted. HEENT: Head: normal shape and size;  Pulmonary/Chest: Normal effortNo respiratory distress. .  Neurological: Alert and oriented.  Psychiatric: Mood and affect normal. Behavior is normal. Judgment and thought content normal.     BMET No results found for: NA, K, CL, CO2, GLUCOSE, BUN, CREATININE, CALCIUM, GFRNONAA, GFRAA  Lipid Panel     Component Value Date/Time   HDL 49 04/25/2008   LDLCALC 59 04/25/2008    CBC No results found for: WBC, RBC, HGB, HCT, PLT, MCV, MCH, MCHC, RDW, LYMPHSABS, MONOABS, EOSABS, BASOSABS  Hgb A1C No results found for: HGBA1C     Assessment and Plan:  Preventative Health Maintenance:  Encouraged him to get a flu shot in the fall He is sure is tetanus is UTD He declines colonoscopy but is agreeable to Cologuard- will order Encouraged him to consume a balanced diet and exercise regimen Advised him to see an eye doctor and dentist annually Will check CBC, CMET, Lipid and PSA today  RTC in 1 year, sooner if needed  Follow Up Instructions:    I discussed the assessment and treatment plan with the patient. The patient was provided an opportunity to ask questions and all were answered. The patient agreed with the plan and demonstrated an understanding of the  instructions.   The patient was advised to call back or seek an in-person evaluation if the symptoms worsen or if the condition fails to improve as anticipated.    [name], NP

## 2019-04-18 ENCOUNTER — Encounter: Payer: Self-pay | Admitting: Internal Medicine

## 2019-04-18 DIAGNOSIS — B009 Herpesviral infection, unspecified: Secondary | ICD-10-CM | POA: Insufficient documentation

## 2019-04-18 NOTE — Assessment & Plan Note (Signed)
Off CPAP  Monitor

## 2019-04-18 NOTE — Patient Instructions (Signed)

## 2019-04-18 NOTE — Assessment & Plan Note (Signed)
Continue Valtrex suppression Refilled today

## 2019-04-22 ENCOUNTER — Other Ambulatory Visit: Payer: BC Managed Care – PPO

## 2019-04-22 ENCOUNTER — Other Ambulatory Visit: Payer: Self-pay

## 2019-11-13 ENCOUNTER — Ambulatory Visit: Payer: BC Managed Care – PPO | Attending: Internal Medicine

## 2019-11-13 DIAGNOSIS — Z23 Encounter for immunization: Secondary | ICD-10-CM

## 2019-11-13 NOTE — Progress Notes (Signed)
   Covid-19 Vaccination Clinic  Name:  DKARI DORNFELD    MRN: [id number] DOB: 1965-10-20  11/13/2019  Mr. Tikkanen was observed post Covid-19 immunization for 15 minutes without incident. He was provided with Vaccine Information Sheet and instruction to access the V-Safe system.   Mr. Sanson was instructed to call 911 with any severe reactions post vaccine: Marland Kitchen Difficulty breathing  . Swelling of face and throat  . A fast heartbeat  . A bad rash all over body  . Dizziness and weakness   Immunizations Administered    Name Date Dose VIS Date Route   Pfizer COVID-19 Vaccine 11/13/2019  7:54 PM 0.3 mL 08/06/2019 Intramuscular   Manufacturer: [place]   Lot: [id number]   [place]: [id number]

## 2019-12-04 ENCOUNTER — Other Ambulatory Visit: Payer: Self-pay

## 2019-12-04 ENCOUNTER — Ambulatory Visit: Payer: BC Managed Care – PPO | Attending: Internal Medicine

## 2019-12-04 DIAGNOSIS — Z23 Encounter for immunization: Secondary | ICD-10-CM

## 2019-12-04 NOTE — Progress Notes (Signed)
   Covid-19 Vaccination Clinic  Name:  Troy Perry    MRN: [id number] DOB: 15-Mar-1966  12/04/2019  Troy Perry was observed post Covid-19 immunization for 15 minutes without incident. He was provided with Vaccine Information Sheet and instruction to access the V-Safe system.   Troy Perry was instructed to call 911 with any severe reactions post vaccine: Marland Kitchen Difficulty breathing  . Swelling of face and throat  . A fast heartbeat  . A bad rash all over body  . Dizziness and weakness   Immunizations Administered    Name Date Dose VIS Date Route   Pfizer COVID-19 Vaccine 12/04/2019  6:24 PM 0.3 mL 08/06/2019 Intramuscular   Manufacturer: [place]   Lot: [id number]   [place]: [id number]

## 2020-01-31 ENCOUNTER — Telehealth: Payer: Self-pay

## 2020-01-31 NOTE — Telephone Encounter (Signed)
[facility] - Client TELEPHONE ADVICE RECORD [facility] Patient Name: Troy Perry Gender: Male DOB: [birth year] Age: 54 Y 2 M 11 D Return Phone Number: [phone] (Primary) Address: City/State/Zip: [place] [address] Client [facility] - Client Client Site [place] - Day Physician [name] - NP Contact Type Call Who Is Calling Patient / Member / Family / Caregiver Call Type Triage / Clinical Relationship To Patient Self Return Phone Number [phone] (Primary) Chief Complaint Dizziness Reason for Call Symptomatic / Request for [place] states, pt having dizziness Translation No Nurse Assessment Nurse: Zenia Resides, RN, Diane Date/Time (Eastern Time): 01/31/2020 10:06:14 AM Confirm and document reason for call. If symptomatic, describe symptoms. ---Caller states he is normally fine and healthy. His AIC was up at one point. Pt. is having some mild dizziness when he sits up or stands up then he is okay. AIC went from 6.1 down to 5.6. Pt. is doing intermittent fasting for a year and half. Pt. is drinking plenty of fluids. Has the patient had close contact with a person known or suspected to have the novel coronavirus illness OR traveled / lives in area with major community spread (including international travel) in the last 14 days from the onset of symptoms? * If Asymptomatic, screen for exposure and travel within the last 14 days. ---No Does the patient have any new or worsening symptoms? ---Yes Will a triage be completed? ---Yes Related visit to physician within the last 2 weeks? ---No Does the PT have any chronic conditions? (i.e. diabetes, asthma, this includes High risk factors for pregnancy, etc.) ---No Is this a behavioral health or substance abuse call? ---No Guidelines Guideline Title Affirmed Question Affirmed Notes Nurse Date/Time  (Eastern Time) Dizziness - Lightheadedness [1] MILD dizziness (e.g., walking normally) AND [2] has NOT been evaluated by physician for this (Exception: dizziness caused by heat exposure, Zenia Resides, RN, Diane 01/31/2020 10:10:21 AMPLEASE NOTE: All timestamps contained within this report are represented as [place] Standard Time. CONFIDENTIALTY NOTICE: This fax transmission is intended only for the addressee. It contains information that is legally privileged, confidential or otherwise protected from use or disclosure. If you are not the intended recipient, you are strictly prohibited from reviewing, disclosing, copying using or disseminating any of this information or taking any action in reliance on or regarding this information. If you have received this fax in error, please notify us immediately by telephone so that we can arrange for its return to [place]. Phone: [phone], Toll-Free: [phone], Fax: [phone] Page: 2 of 2 Call Id: [id number] Guidelines Guideline Title Affirmed Question Affirmed Notes Nurse Date/Time Eilene Ghazi Time) sudden standing, or poor fluid intake) Disp. Time Eilene Ghazi Time) Disposition Final User 01/31/2020 10:12:32 AM SEE PCP WITHIN 3 DAYS Yes Zenia Resides, RN, Diane Caller Disagree/Comply Comply Caller Understands Yes PreDisposition Did not know what to do Care Advice Given Per Guideline SEE PCP WITHIN 3 DAYS: * You need to be seen within 2 or 3 days. DRINK FLUIDS: * Drink several glasses of fruit juice, other clear fluids or water. FALL PREVENTION: * Sit at side of bed for several minutes before standing up. Stand up slowly. CALL BACK IF: * Passes out (faints) CARE ADVICE given per Dizziness (Adult) guideline. Referrals REFERRED TO PCP OFFICE

## 2020-02-01 ENCOUNTER — Other Ambulatory Visit: Payer: Self-pay

## 2020-02-01 ENCOUNTER — Ambulatory Visit (INDEPENDENT_AMBULATORY_CARE_PROVIDER_SITE_OTHER): Payer: BC Managed Care – PPO | Admitting: Internal Medicine

## 2020-02-01 ENCOUNTER — Encounter: Payer: Self-pay | Admitting: Internal Medicine

## 2020-02-01 VITALS — BP 128/80 | HR 63 | Temp 98.2°F | Ht 68.75 in | Wt 210.4 lb

## 2020-02-01 DIAGNOSIS — R42 Dizziness and giddiness: Secondary | ICD-10-CM

## 2020-02-01 DIAGNOSIS — I951 Orthostatic hypotension: Secondary | ICD-10-CM

## 2020-02-01 DIAGNOSIS — R5383 Other fatigue: Secondary | ICD-10-CM | POA: Diagnosis not present

## 2020-02-01 DIAGNOSIS — F439 Reaction to severe stress, unspecified: Secondary | ICD-10-CM

## 2020-02-01 LAB — VITAMIN D 25 HYDROXY (VIT D DEFICIENCY, FRACTURES): VITD: 35.9 ng/mL (ref 30.00–100.00)

## 2020-02-01 LAB — BASIC METABOLIC PANEL
BUN: 17 mg/dL (ref 6–23)
CO2: 32 mEq/L (ref 19–32)
Calcium: 9.9 mg/dL (ref 8.4–10.5)
Chloride: 98 mEq/L (ref 96–112)
Creatinine, Ser: 1.01 mg/dL (ref 0.40–1.50)
GFR: 76.64 mL/min (ref >60.00)
Glucose, Bld: 90 mg/dL (ref 70–99)
Potassium: 4.6 mEq/L (ref 3.5–5.1)
Sodium: 137 mEq/L (ref 135–145)

## 2020-02-01 LAB — CBC
HCT: 40.8 % (ref 39.0–52.0)
Hemoglobin: 13.8 g/dL (ref 13.0–17.0)
MCHC: 33.8 g/dL (ref 30.0–36.0)
MCV: 95.5 fl (ref 78.0–100.0)
Platelets: 252 10*3/uL (ref 150.0–400.0)
RBC: 4.27 Mil/uL (ref 4.22–5.81)
RDW: 13.6 % (ref 11.5–15.5)
WBC: 4.7 10*3/uL (ref 4.0–10.5)

## 2020-02-01 LAB — VITAMIN B12: Vitamin B-12: 224 pg/mL (ref 211–911)

## 2020-02-01 LAB — TSH: TSH: 1.32 u[IU]/mL (ref 0.35–4.50)

## 2020-02-01 NOTE — Patient Instructions (Signed)

## 2020-02-01 NOTE — Progress Notes (Signed)
Subjective:    Patient ID: Troy Perry, male    DOB: [birth year], 54 y.o.   MRN: [id number]  HPI  Pt presents to the clinic today with c/o fatigue and dizziness. He reports the fatigue started 6 months ago, the dizziness started about 3 weeks ago. He is averaging less than 6 hours of sleep per night. He does not feel rested when he wakes up. He does have OSA, controlled with a mouthpiece. He reports the dizziness is more like a sensation of imbalnce, blurriness. It is intermittent but is occurring more frequently and at random times. He has noticed it with position changes, after working out and when he felt like he needed to eat. He reports the dizziness sensation will pass as quickly as it comes. He does have chronic allergies, controlled with OTC medications. He is drinking enough water, at least 8 20 oz bottles daily. He denies any neck pain or trauma. He has been under a significant amount of stress. He has had a vision exam in the last year. He has not taken anything OTC for these symptoms.  Review of Systems  Past Medical History:  Diagnosis Date  . Allergy   . Chicken pox   . GERD (gastroesophageal reflux disease)     Current Outpatient Medications  Medication Sig Dispense Refill  . aspirin 81 MG tablet Take 81 mg by mouth daily.    . Multiple Vitamins-Minerals (MULTIVITAMIN WITH MINERALS) tablet Take 1 tablet by mouth daily.    . valACYclovir (VALTREX) 500 MG tablet Take 1 tablet (500 mg total) by mouth daily. 90 tablet 3   No current facility-administered medications for this visit.    No Known Allergies  Family History  Problem Relation Age of Onset  . Hyperlipidemia Father   . Hypertension Father   . Diabetes Father   . Arthritis Maternal Grandfather   . Cancer Maternal Grandfather        Prostate  . Diabetes Paternal Grandfather     Social History   Socioeconomic History  . Marital status: Married    Spouse name: Not on file  . Number of children: Not on file   . Years of education: Not on file  . Highest education level: Not on file  Occupational History  . Occupation: IT   Tobacco Use  . Smoking status: Never Smoker  . Smokeless tobacco: Never Used  Substance and Sexual Activity  . Alcohol use: Yes    Alcohol/week: 10.0 standard drinks    Types: 8 Glasses of wine, 2 Standard drinks or equivalent per week    Comment: social--daily  . Drug use: No  . Sexual activity: Yes  Other Topics Concern  . Not on file  Social History Narrative  . Not on file   Social Determinants of Health   Financial Resource Strain:   . Difficulty of Paying Living Expenses:   Food Insecurity:   . Worried About Charity fundraiser in the Last Year:   . Arboriculturist in the Last Year:   Transportation Needs:   . Film/video editor (Medical):   Marland Kitchen Lack of Transportation (Non-Medical):   Physical Activity:   . Days of Exercise per Week:   . Minutes of Exercise per Session:   Stress:   . Feeling of Stress :   Social Connections:   . Frequency of Communication with Friends and Family:   . Frequency of Social Gatherings with Friends and Family:   . Attends  Religious Services:   . Active Member of Clubs or Organizations:   . Attends Archivist Meetings:   Marland Kitchen Marital Status:   Intimate Partner Violence:   . Fear of Current or Ex-Partner:   . Emotionally Abused:   Marland Kitchen Physically Abused:   . Sexually Abused:      Constitutional: Denies fever, malaise, fatigue, headache or abrupt weight changes.  HEENT: Pt reports intermittent runny nose, nasal congestion. Denies eye pain, eye redness, ear pain, ringing in the ears, wax buildup, bloody nose, or sore throat. Respiratory: Denies difficulty breathing, shortness of breath, cough or sputum production.   Cardiovascular: Denies chest pain, chest tightness, palpitations or swelling in the hands or feet.  Musculoskeletal: Denies decrease in range of motion, difficulty with gait, muscle pain or joint  pain and swelling.  Skin: Denies redness, rashes, lesions or ulcercations.  Neurological: Pt reports dizziness. Denies difficulty with memory, difficulty with speech or problems with balance and coordination.  Psych: Pt reports stress. Denies anxiety, depression, SI/HI.  No other specific complaints in a complete review of systems (except as listed in HPI above).     Objective:   Physical Exam  BP 128/80 (BP Location: Left Arm, Patient Position: Sitting, Cuff Size: Normal)   Pulse 63   Temp 98.2 F (36.8 C) (Temporal)   Ht 5' 8.75" (1.746 m)   Wt 210 lb 6.4 oz (95.4 kg)   SpO2 96%   BMI 31.30 kg/m   Wt Readings from Last 3 Encounters:  04/16/19 214 lb (97.1 kg)  05/11/18 220 lb (99.8 kg)  10/21/17 200 lb (90.7 kg)    General: Appears his stated age, obese in NAD. Skin: Warm, dry and intact. No rashes, lesions or ulcerations noted. HEENT: Head: normal shape and size; Eyes: sclera white, no icterus, conjunctiva pink, PERRLA and EOMs intact; Ears: Tm's gray and intact, normal light reflex;  Neck:  Neck supple, trachea midline. No masses, lumps or thyromegaly present.  Cardiovascular: Normal rate and rhythm. S1,S2 noted.  No murmur, rubs or gallops noted. No JVD or BLE edema. No carotid bruits noted. Pulmonary/Chest: Normal effort and positive vesicular breath sounds. No respiratory distress. No wheezes, rales or ronchi noted.  Musculoskeletal:  No difficulty with gait.  Neurological: Alert and oriented. No nystagmus. Negative Romberg. Coordination normal.  Psychiatric: Mood and affect normal. Behavior is normal. Judgment and thought content normal.    BMET No results found for: NA, K, CL, CO2, GLUCOSE, BUN, CREATININE, CALCIUM, GFRNONAA, GFRAA  Lipid Panel     Component Value Date/Time   HDL 49 04/25/2008 0000   LDLCALC 59 04/25/2008 0000    CBC No results found for: WBC, RBC, HGB, HCT, PLT, MCV, MCH, MCHC, RDW, LYMPHSABS, MONOABS, EOSABS, BASOSABS  Hgb A1C No results  found for: HGBA1C         Assessment & Plan:  Fatigue, Dizziness, Stress:  Exam benign Orthostatics positive but he was not symptomatic as far as dizziness Will check CBC, BMET, TSH, Vit D and B12 today ? Stress related  Will follow up after labs, return precautions discussed  [name], NP This visit occurred during the SARS-CoV-2 public health emergency.  Safety protocols were in place, including screening questions prior to the visit, additional usage of staff PPE, and extensive cleaning of exam room while observing appropriate contact time as indicated for disinfecting solutions.

## 2020-02-02 ENCOUNTER — Other Ambulatory Visit (INDEPENDENT_AMBULATORY_CARE_PROVIDER_SITE_OTHER): Payer: BC Managed Care – PPO

## 2020-02-02 DIAGNOSIS — Z131 Encounter for screening for diabetes mellitus: Secondary | ICD-10-CM | POA: Diagnosis not present

## 2020-02-02 NOTE — Telephone Encounter (Signed)
Can we add on A1C?

## 2020-02-04 LAB — HEMOGLOBIN A1C: Hgb A1c MFr Bld: 5.9 % (ref 4.6–6.5)

## 2020-03-14 DIAGNOSIS — M9904 Segmental and somatic dysfunction of sacral region: Secondary | ICD-10-CM | POA: Diagnosis not present

## 2020-03-14 DIAGNOSIS — M461 Sacroiliitis, not elsewhere classified: Secondary | ICD-10-CM | POA: Diagnosis not present

## 2020-03-14 DIAGNOSIS — M9903 Segmental and somatic dysfunction of lumbar region: Secondary | ICD-10-CM | POA: Diagnosis not present

## 2020-03-14 DIAGNOSIS — M545 Low back pain: Secondary | ICD-10-CM | POA: Diagnosis not present

## 2020-08-15 DIAGNOSIS — Z20822 Contact with and (suspected) exposure to covid-19: Secondary | ICD-10-CM | POA: Diagnosis not present

## 2020-08-15 DIAGNOSIS — Z03818 Encounter for observation for suspected exposure to other biological agents ruled out: Secondary | ICD-10-CM | POA: Diagnosis not present

## 2020-11-24 ENCOUNTER — Telehealth: Payer: Self-pay | Admitting: Internal Medicine

## 2020-11-24 NOTE — Telephone Encounter (Signed)
Ok for in-office 

## 2020-11-24 NOTE — Telephone Encounter (Signed)
[facility] - Client TELEPHONE ADVICE RECORD [facility] Patient Name: Troy Perry Gender: Male DOB: 1966-01-27 Age: 55 Y 5 D Return Phone Number: [phone] (Primary) Address: City/ State/ Zip: [place] [address] Client [name] - Client Client Site [facility] - Day Physician [name] - NP Contact Type Call Who Is Calling Patient / Member / Family / Caregiver Call Type Triage / Clinical Relationship To Patient Self Return Phone Number [phone] (Primary) Chief Complaint BREATHING - shortness of breath or sounds breathless Reason for Call Symptomatic / Request for [place] states, pt is having increased fatigue, sob, and muscle aches. Translation No Nurse Assessment Nurse: Velta Addison, RN, Crystal Date/Time (Eastern Time): 11/24/2020 11:39:28 AM Confirm and document reason for call. If symptomatic, describe symptoms. ---Caller states, pt is having increased fatigue, sob, and muscle aches. Caller states this started about 3 weeks ago, not getting any better, but not getting worse. State he is getting SOB when he is cycling and pushing himself. No thermometer, covid test (at home) has been negative for the past 3 weeks. Has sinus drainage. Does the patient have any new or worsening symptoms? ---Yes Will a triage be completed? ---Yes Related visit to physician within the last 2 weeks? ---No Does the PT have any chronic conditions? (i.e. diabetes, asthma, this includes High risk factors for pregnancy, etc.) ---Yes List chronic conditions. ---DM diet controlled Is this a behavioral health or substance abuse call? ---No Guidelines Guideline Title Affirmed Question Affirmed Notes Nurse Date/Time Eilene Ghazi Time) Ankle Swelling [1] Thigh, calf, or ankle swelling AND [2] only 1 side Parrott, RN, Crystal 11/24/2020 11:48:31 AM Disp. Time  Eilene Ghazi Time) Disposition Final User 11/24/2020 11:38:21 AM Send to Urgent Queue Lonia Farber PLEASE NOTE: All timestamps contained within this report are represented as [place] Standard Time. CONFIDENTIALTY NOTICE: This fax transmission is intended only for the addressee. It contains information that is legally privileged, confidential or otherwise protected from use or disclosure. If you are not the intended recipient, you are strictly prohibited from reviewing, disclosing, copying using or disseminating any of this information or taking any action in reliance on or regarding this information. If you have received this fax in error, please notify us immediately by telephone so that we can arrange for its return to [place]. Phone: [phone], Toll-Free: [phone], Fax: [phone] Page: 2 of 4 Call Id: [id number] 11/24/2020 11:54:12 AM See HCP within 4 Hours (or PCP triage) Yes Velta Addison, RN, [place] Disagree/Comply Comply Caller Understands Yes PreDisposition Call Doctor Care Advice Given Per Guideline SEE HCP (OR PCP TRIAGE) WITHIN 4 HOURS: CARE ADVICE given per Ankle Joint, Swelling of (Adult) guideline. * You develop any chest pain or shortness of breath. CALL EMS IF: Comments User: Hamilton Capri, RN Date/Time (Eastern Time): 11/24/2020 11:44:33 AM Tired all the time and just cant shake it. Noticed swelling in knee's and ankles, this is new. Only in Left leg. User: Hamilton Capri, RN Date/Time Eilene Ghazi Time): 11/24/2020 11:52:14 AM Has been off for a week and generalized weakness and swelling are not getting any better, or any worse. User: Hamilton Capri, RN Date/Time Eilene Ghazi Time): 11/24/2020 11:59:01 AM Called office and no appts for today. I advised pt to go to UC today. States he feels more comfortable with going to office and since not getting worse he would like appt there for the beginning of the week. I connected him to office but advised that if  he had any changes at  all over the weekend to go to UC/ED. Verbalized understanding. Referrals REFERRED TO PCP OFFICE Triage Details User: Velta Addison, RN, Crystal Date/Time: 11/24/2020 11:48:31 AM Triage Guideline: Weakness (Generalized) and Fatigue Triage Guideline: Knee Swelling Triage Guideline: Ankle Swelling Chest Pain -----No Followed an ankle injury -----No Ankle pain is main symptom -----No Swelling of both ankles (i.e., pedal edema) PLEASE NOTE: All timestamps contained within this report are represented as [place] Standard Time. CONFIDENTIALTY NOTICE: This fax transmission is intended only for the addressee. It contains information that is legally privileged, confidential or otherwise protected from use or disclosure. If you are not the intended recipient, you are strictly prohibited from reviewing, disclosing, copying using or disseminating any of this information or taking any action in reliance on or regarding this information. If you have received this fax in error, please notify us immediately by telephone so that we can arrange for its return to [place]. Phone: [phone], Toll-Free: [phone], Fax: [phone] Page: 3 of 4 Call Id: [id number] Triage Details -----No Swelling of calf or leg -----No Small area of LOCALIZED swelling followed an insect bite to the area -----No Difficulty breathing -----No Entire foot is cool or blue in comparison to other side -----No Fever -----No Patient sounds very sick or weak to the triager -----No [1] SEVERE pain (e.g., excruciating, unable to walk) AND [2] not improved after 2 hours of pain medicine -----No [1] Can't move swollen ankle at all AND [2] no fever -----No [1] Redness AND [2] painful when touched AND [3] no fever -----No [1] Red area or streak [2] large (> 2 in. or 5 cm) -----No [1] Thigh or calf pain AND [2] only 1 side AND [3] present > 1 hour -----No [1] Thigh, calf, or ankle swelling AND [2] only 1 side -----No [1] Thigh, calf,  or ankle swelling AND [2] bilateral AND [3] 1 side is more swollen -----No [1] Thigh, calf, or ankle swelling AND [2] only 1 side -----Yes Disposition: See HCP within 4 Hours (or PCP triage) SEE HCP (OR PCP TRIAGE) WITHIN 4 HOURS: CARE ADVICE given per Ankle Joint, Swelling of (Adult) guideline. PLEASE NOTE: All timestamps contained within this report are represented as [place] Standard Time. CONFIDENTIALTY NOTICE: This fax transmission is intended only for the addressee. It contains information that is legally privileged, confidential or otherwise protected from use or disclosure. If you are not the intended recipient, you are strictly prohibited from reviewing, disclosing, copying using or disseminating any of this information or taking any action in reliance on or regarding this information. If you have received this fax in error, please notify us immediately by telephone so that we can arrange for its return to [place]. Phone: [phone], Toll-Free: [phone], Fax: [phone] Page: 4 of 4 Call Id: [id number] Triage Details * You develop any chest pain or shortness of breath. CALL EMS IF:

## 2020-11-24 NOTE — Telephone Encounter (Signed)
Patient called in stating he was having fatigue, muscle aches, SOB with exertion. Triaged call with access nurse. Access number called back in stating patient complained of swelling in knee and ankle, along with SOB. Appointment made for 4/4 in 10min slot. Patient has had 3 negative covid AT HOME tests. Would you be okay with this still coming in office or change to virtual? Negative covid screen for other symptoms.

## 2020-11-25 NOTE — Telephone Encounter (Signed)
Will discuss at upcoming appt.

## 2020-11-27 ENCOUNTER — Ambulatory Visit
Admission: RE | Admit: 2020-11-27 | Discharge: 2020-11-27 | Disposition: A | Discharge status: Home / Self Care | Payer: BC Managed Care – PPO | Source: Ambulatory Visit | Attending: Internal Medicine | Admitting: Internal Medicine

## 2020-11-27 ENCOUNTER — Telehealth: Payer: Self-pay | Admitting: *Deleted

## 2020-11-27 ENCOUNTER — Other Ambulatory Visit: Payer: Self-pay

## 2020-11-27 ENCOUNTER — Encounter: Payer: Self-pay | Admitting: Internal Medicine

## 2020-11-27 ENCOUNTER — Ambulatory Visit (INDEPENDENT_AMBULATORY_CARE_PROVIDER_SITE_OTHER): Payer: BC Managed Care – PPO | Admitting: Internal Medicine

## 2020-11-27 VITALS — BP 138/80 | HR 61 | Temp 98.3°F | Wt 221.0 lb

## 2020-11-27 DIAGNOSIS — I82412 Acute embolism and thrombosis of left femoral vein: Secondary | ICD-10-CM | POA: Diagnosis not present

## 2020-11-27 DIAGNOSIS — M7989 Other specified soft tissue disorders: Secondary | ICD-10-CM | POA: Diagnosis not present

## 2020-11-27 DIAGNOSIS — I82432 Acute embolism and thrombosis of left popliteal vein: Secondary | ICD-10-CM | POA: Diagnosis not present

## 2020-11-27 DIAGNOSIS — I82462 Acute embolism and thrombosis of left calf muscular vein: Secondary | ICD-10-CM

## 2020-11-27 DIAGNOSIS — R600 Localized edema: Secondary | ICD-10-CM | POA: Diagnosis not present

## 2020-11-27 MED ORDER — APIXABAN (ELIQUIS) VTE STARTER PACK (10MG AND 5MG): ORAL_TABLET | ORAL | 0 refills | Status: DC

## 2020-11-27 NOTE — Addendum Note (Signed)
Addended by: Lurlean Nanny on: 11/27/2020 05:09 PM   Modules accepted: Orders

## 2020-11-27 NOTE — Progress Notes (Signed)
Subjective:    Patient ID: Troy Perry, male    DOB: 1965-10-13, 55 y.o.   MRN: [id number]  HPI  Patient presents the clinic today with an episode about 2 weeks ago where he felt a pop in the left side of his ribs, and had subsequent shortness of breath with exertion. This lasted for about 3-4 days. He reports one night he was getting up from the couch and heard another pop in the left side of his ribs and his shortness of breath with exertion resolved.  He also reports swelling, redness, numbness and tingling of his left lower extremity. He noticed this about 1 week ago. He reports the swelling feels tight, but no painful. He has had a little discomfort in the calf but not severe. He does have low back pain but this has not worsened lately. He denies loss of bowel or bladder control. He denies weakness of the LLE. He has not taken anything OTC for this.  He also complains of swelling in his knee and ankle.  Review of Systems      Past Medical History:  Diagnosis Date  . Allergy   . Chicken pox   . GERD (gastroesophageal reflux disease)     Current Outpatient Medications  Medication Sig Dispense Refill  . aspirin 81 MG tablet Take 81 mg by mouth daily.    . Multiple Vitamins-Minerals (MULTIVITAMIN WITH MINERALS) tablet Take 1 tablet by mouth daily.    . valACYclovir (VALTREX) 500 MG tablet Take 1 tablet (500 mg total) by mouth daily. 90 tablet 3   No current facility-administered medications for this visit.    No Known Allergies  Family History  Problem Relation Age of Onset  . Hyperlipidemia Father   . Hypertension Father   . Diabetes Father   . Arthritis Maternal Grandfather   . Cancer Maternal Grandfather        Prostate  . Diabetes Paternal Grandfather     Social History   Socioeconomic History  . Marital status: Married    Spouse name: Not on file  . Number of children: Not on file  . Years of education: Not on file  . Highest education level: Not on file   Occupational History  . Occupation: IT   Tobacco Use  . Smoking status: Never Smoker  . Smokeless tobacco: Never Used  Substance and Sexual Activity  . Alcohol use: Yes    Alcohol/week: 10.0 standard drinks    Types: 8 Glasses of wine, 2 Standard drinks or equivalent per week    Comment: social--daily  . Drug use: No  . Sexual activity: Yes  Other Topics Concern  . Not on file  Social History Narrative  . Not on file   Social Determinants of Health   Financial Resource Strain: Not on file  Food Insecurity: Not on file  Transportation Needs: Not on file  Physical Activity: Not on file  Stress: Not on file  Social Connections: Not on file  Intimate Partner Violence: Not on file     Constitutional: Denies fever, malaise, fatigue, headache or abrupt weight changes.  Respiratory: Denies difficulty breathing, shortness of breath, cough or sputum production.   Cardiovascular: Pt reports swelling of LLE. Denies chest pain, chest tightness, palpitations or swelling in the hands.  Musculoskeletal:   Denies decrease in range of motion, difficulty with gait, muscle pain or joint pain or swelling.  Skin: Pt reports redness of LLE. Denies rashes, lesions or ulcercations.  Neurological: Pt  reports numbness and tingling of his LLE. Denies problems with balance and coordination.    No other specific complaints in a complete review of systems (except as listed in HPI above).  Objective:   Physical Exam  BP 138/80   Pulse 61   Temp 98.3 F (36.8 C) (Temporal)   Wt 221 lb (100.2 kg)   SpO2 98%   BMI 32.87 kg/m   Wt Readings from Last 3 Encounters:  02/01/20 210 lb 6.4 oz (95.4 kg)  04/16/19 214 lb (97.1 kg)  05/11/18 220 lb (99.8 kg)    General: Appears his stated age, obese, in NAD. Skin: Warm, dry and intact. No redness or warmth noted of LLE. HEENT: Head: normal shape and size; Eyes: sclera white, no icterus, conjunctiva pink, PERRLA and EOMs intact;  Cardiovascular:  Normal rate and rhythm. S1,S2 noted.  No murmur, rubs or gallops noted. 1+ pitting LLE edema. Negative Homan's sign. Pedal pulse 2+ on the left. Pulmonary/Chest: Normal effort and positive vesicular breath sounds. No respiratory distress. No wheezes, rales or ronchi noted.  Musculoskeletal: Normal flexion, extension and rotation of the spine. No bony tenderness noted over the spine. Joint enlargement of the left ankle noted without swelling. Strength 5/5 BLE. No difficulty with gait.  Neurological: Alert and oriented.   BMET    Component Value Date/Time   NA 137 02/01/2020 1056   K 4.6 02/01/2020 1056   CL 98 02/01/2020 1056   CO2 32 02/01/2020 1056   GLUCOSE 90 02/01/2020 1056   BUN 17 02/01/2020 1056   CREATININE 1.01 02/01/2020 1056   CALCIUM 9.9 02/01/2020 1056    Lipid Panel     Component Value Date/Time   HDL 49 04/25/2008 0000   LDLCALC 59 04/25/2008 0000    CBC    Component Value Date/Time   WBC 4.7 02/01/2020 1056   RBC 4.27 02/01/2020 1056   HGB 13.8 02/01/2020 1056   HCT 40.8 02/01/2020 1056   PLT 252.0 02/01/2020 1056   MCV 95.5 02/01/2020 1056   MCHC 33.8 02/01/2020 1056   RDW 13.6 02/01/2020 1056    Hgb A1C Lab Results  Component Value Date   HGBA1C 5.9 02/02/2020            Assessment & Plan:   LLE Swelling:  Will obtain venous doppler of LLE Encouraged elevation, low salt diet If doppler negative and swelling persists, consider 3-5 day course of diuretic to help reduce swelling along with basic lab workup  Will follow up after venous doppler results, return precautions discussed [name], NP This visit occurred during the SARS-CoV-2 public health emergency.  Safety protocols were in place, including screening questions prior to the visit, additional usage of staff PPE, and extensive cleaning of exam room while observing appropriate contact time as indicated for disinfecting solutions.

## 2020-11-27 NOTE — Patient Instructions (Signed)
Peripheral Edema  Peripheral edema is swelling that is caused by a buildup of fluid. Peripheral edema most often affects the lower legs, ankles, and feet. It can also develop in the arms, hands, and face. The area of the body that has peripheral edema will look swollen. It may also feel heavy or warm. Your clothes may start to feel tight. Pressing on the area may make a temporary dent in your skin. You may not be able to move your swollen arm or leg as much as usual. There are many causes of peripheral edema. It can happen because of a complication of other conditions such as congestive heart failure, kidney disease, or a problem with your blood circulation. It also can be a side effect of certain medicines or because of an infection. It often happens to women during pregnancy. Sometimes, the cause is not known. Follow these instructions at home: Managing pain, stiffness, and swelling  Raise (elevate) your legs while you are sitting or lying down.  Move around often to prevent stiffness and to lessen swelling.  Do not sit or stand for long periods of time.  Wear support stockings as told by your health care provider.   Medicines  Take over-the-counter and prescription medicines only as told by your health care provider.  Your health care provider may prescribe medicine to help your body get rid of excess water (diuretic). General instructions  Pay attention to any changes in your symptoms.  Follow instructions from your health care provider about limiting salt (sodium) in your diet. Sometimes, eating less salt may reduce swelling.  Moisturize skin daily to help prevent skin from cracking and draining.  Keep all follow-up visits as told by your health care provider. This is important. Contact a health care provider if you have:  A fever.  Edema that starts suddenly or is getting worse, especially if you are pregnant or have a medical condition.  Swelling in only one leg.  Increased  swelling, redness, or pain in one or both of your legs.  Drainage or sores at the area where you have edema. Get help right away if you:  Develop shortness of breath, especially when you are lying down.  Have pain in your chest or abdomen.  Feel weak.  Feel faint. Summary  Peripheral edema is swelling that is caused by a buildup of fluid. Peripheral edema most often affects the lower legs, ankles, and feet.  Move around often to prevent stiffness and to lessen swelling. Do not sit or stand for long periods of time.  Pay attention to any changes in your symptoms.  Contact a health care provider if you have edema that starts suddenly or is getting worse, especially if you are pregnant or have a medical condition.  Get help right away if you develop shortness of breath, especially when lying down. This information is not intended to replace advice given to you by your health care provider. Make sure you discuss any questions you have with your health care provider. Document Revised: 05/06/2018 Document Reviewed: 05/06/2018 Elsevier Patient Education  2021 [employer].

## 2020-11-27 NOTE — Telephone Encounter (Signed)
Caryl Pina at [facility] called stating that the ultrasound results are in Epic.  Caryl Pina stated that patient is in the lobby and the results are positive for a DVT.  Spoke to [name] NP and was advised that she has already spoken with the patient and this has been taken care of. Caryl Pina advised.

## 2020-11-27 NOTE — Addendum Note (Signed)
Addended by: Jearld Fenton on: 11/27/2020 08:52 PM   Modules accepted: Orders

## 2020-11-28 ENCOUNTER — Encounter: Payer: Self-pay | Admitting: Internal Medicine

## 2020-11-30 ENCOUNTER — Inpatient Hospital Stay: Payer: BC Managed Care – PPO

## 2020-11-30 ENCOUNTER — Encounter: Payer: Self-pay | Admitting: Oncology

## 2020-11-30 ENCOUNTER — Inpatient Hospital Stay: Payer: BC Managed Care – PPO | Attending: Oncology | Admitting: Oncology

## 2020-11-30 VITALS — BP 144/94 | HR 63 | Temp 97.5°F | Resp 16 | Wt 218.4 lb

## 2020-11-30 DIAGNOSIS — Z79899 Other long term (current) drug therapy: Secondary | ICD-10-CM | POA: Insufficient documentation

## 2020-11-30 DIAGNOSIS — Z803 Family history of malignant neoplasm of breast: Secondary | ICD-10-CM | POA: Diagnosis not present

## 2020-11-30 DIAGNOSIS — Z7982 Long term (current) use of aspirin: Secondary | ICD-10-CM | POA: Diagnosis not present

## 2020-11-30 DIAGNOSIS — Z8042 Family history of malignant neoplasm of prostate: Secondary | ICD-10-CM | POA: Insufficient documentation

## 2020-11-30 DIAGNOSIS — Z7901 Long term (current) use of anticoagulants: Secondary | ICD-10-CM | POA: Diagnosis not present

## 2020-11-30 DIAGNOSIS — R918 Other nonspecific abnormal finding of lung field: Secondary | ICD-10-CM | POA: Diagnosis not present

## 2020-11-30 DIAGNOSIS — I82412 Acute embolism and thrombosis of left femoral vein: Secondary | ICD-10-CM | POA: Diagnosis not present

## 2020-11-30 DIAGNOSIS — R0602 Shortness of breath: Secondary | ICD-10-CM | POA: Diagnosis not present

## 2020-11-30 DIAGNOSIS — I82419 Acute embolism and thrombosis of unspecified femoral vein: Secondary | ICD-10-CM

## 2020-11-30 DIAGNOSIS — D6852 Prothrombin gene mutation: Secondary | ICD-10-CM | POA: Insufficient documentation

## 2020-11-30 LAB — CBC WITH DIFFERENTIAL/PLATELET
Abs Immature Granulocytes: 0.02 10*3/uL (ref 0.00–0.07)
Basophils Absolute: 0 10*3/uL (ref 0.0–0.1)
Basophils Relative: 1 %
Eosinophils Absolute: 0.1 10*3/uL (ref 0.0–0.5)
Eosinophils Relative: 3 %
HCT: 41.8 % (ref 39.0–52.0)
Hemoglobin: 14.1 g/dL (ref 13.0–17.0)
Immature Granulocytes: 0 %
Lymphocytes Relative: 36 %
Lymphs Abs: 1.8 10*3/uL (ref 0.7–4.0)
MCH: 31.5 pg (ref 26.0–34.0)
MCHC: 33.7 g/dL (ref 30.0–36.0)
MCV: 93.5 fL (ref 80.0–100.0)
Monocytes Absolute: 0.6 10*3/uL (ref 0.1–1.0)
Monocytes Relative: 12 %
Neutro Abs: 2.4 10*3/uL (ref 1.7–7.7)
Neutrophils Relative %: 48 %
Platelets: 332 10*3/uL (ref 150–400)
RBC: 4.47 MIL/uL (ref 4.22–5.81)
RDW: 12.2 % (ref 11.5–15.5)
WBC: 5.1 10*3/uL (ref 4.0–10.5)
nRBC: 0 % (ref 0.0–0.2)

## 2020-11-30 LAB — COMPREHENSIVE METABOLIC PANEL
ALT: 34 U/L (ref 0–44)
AST: 26 U/L (ref 15–41)
Albumin: 4.2 g/dL (ref 3.5–5.0)
Alkaline Phosphatase: 58 U/L (ref 38–126)
Anion gap: 10 (ref 5–15)
BUN: 14 mg/dL (ref 6–20)
CO2: 31 mmol/L (ref 22–32)
Calcium: 9.9 mg/dL (ref 8.9–10.3)
Chloride: 98 mmol/L (ref 98–111)
Creatinine, Ser: 0.96 mg/dL (ref 0.61–1.24)
GFR, Estimated: >60 mL/min (ref >60)
Glucose, Bld: 89 mg/dL (ref 70–99)
Potassium: 4.4 mmol/L (ref 3.5–5.1)
Sodium: 139 mmol/L (ref 135–145)
Total Bilirubin: 0.5 mg/dL (ref 0.3–1.2)
Total Protein: 8.1 g/dL (ref 6.5–8.1)

## 2020-11-30 LAB — TECHNOLOGIST SMEAR REVIEW
Plt Morphology: NORMAL
RBC Morphology: NORMAL
WBC Morphology: NORMAL

## 2020-11-30 LAB — PSA: Prostatic Specific Antigen: 0.71 ng/mL (ref 0.00–4.00)

## 2020-11-30 LAB — LACTATE DEHYDROGENASE: LDH: 161 U/L (ref 98–192)

## 2020-11-30 NOTE — Progress Notes (Signed)
Hematology/Oncology Consult note [facility] [phone] [phone]   Patient Care Team: Jearld Fenton, NP as PCP - General (Internal Medicine)  REFERRING PROVIDER: Jearld Fenton, NP  CHIEF COMPLAINTS/REASON FOR VISIT:  Evaluation of acute DVT  HISTORY OF PRESENTING ILLNESS:   Troy Perry is a  55 y.o.  male with PMH listed below was seen in consultation at the request of  Jearld Fenton, NP  for evaluation of acute DVT  Patient reports being very active.  2 weeks ago, he has experienced some shortness of breath with exertion as well as left side chest wall discomfort/pain.  A few days later, he reports tightness of the left lower extremity, some calf discomfort.  Patient was seen by primary care provider on 11/27/2020 and left lower extremity venous ultrasound was obtained.  Study showed Acute appearing deep venous thrombosis in the distal left femoral vein, essentially the entire popliteal vein, and calf vein regions. Venous structures elsewhere appear patent on the left. Right common femoral vein patent.  Patient was started on Eliquis starter kit.  He tolerates well.  Shortness of breath has improved. Denies any previous history of thrombosis, denies any family history of thrombosis.  Grandfather had prostate cancer.  Otherwise no cancer family history. Patient has no  immobilization risk factors.  Review of Systems  Constitutional: Negative for appetite change, chills, fatigue and fever.  HENT:   Negative for hearing loss and voice change.   Eyes: Negative for eye problems.  Respiratory: Negative for chest tightness and cough.   Cardiovascular: Negative for chest pain.  Gastrointestinal: Negative for abdominal distention, abdominal pain and blood in stool.  Endocrine: Negative for hot flashes.  Genitourinary: Negative for difficulty urinating and frequency.   Musculoskeletal: Negative for arthralgias.  Skin: Negative for itching and  rash.  Neurological: Negative for extremity weakness.  Hematological: Negative for adenopathy.  Psychiatric/Behavioral: Negative for confusion.    MEDICAL HISTORY:  Past Medical History:  Diagnosis Date  . Allergy   . Chicken pox   . GERD (gastroesophageal reflux disease)     SURGICAL HISTORY: Past Surgical History:  Procedure Laterality Date  . ANTERIOR CRUCIATE LIGAMENT REPAIR Left   . KNEE ARTHROSCOPY W/ MEDIAL COLLATERAL LIGAMENT (MCL) REPAIR Left   . KNEE CARTILAGE SURGERY Bilateral   . NECK SURGERY  2006   C6, C7  . SHOULDER SURGERY Left    and arm    SOCIAL HISTORY: Social History   Socioeconomic History  . Marital status: Married    Spouse name: Not on file  . Number of children: Not on file  . Years of education: Not on file  . Highest education level: Not on file  Occupational History  . Occupation: IT   Tobacco Use  . Smoking status: Never Smoker  . Smokeless tobacco: Never Used  Substance and Sexual Activity  . Alcohol use: Yes    Alcohol/week: 10.0 standard drinks    Types: 8 Glasses of wine, 2 Standard drinks or equivalent per week    Comment: social--daily  . Drug use: No  . Sexual activity: Yes  Other Topics Concern  . Not on file  Social History Narrative  . Not on file   Social Determinants of Health   Financial Resource Strain: Not on file  Food Insecurity: Not on file  Transportation Needs: Not on file  Physical Activity: Not on file  Stress: Not on file  Social Connections: Not on file  Intimate Partner  Violence: Not on file    FAMILY HISTORY: Family History  Problem Relation Age of Onset  . Hyperlipidemia Father   . Hypertension Father   . Diabetes Father   . Arthritis Maternal Grandfather   . Cancer Maternal Grandfather        Prostate  . Diabetes Paternal Grandfather     ALLERGIES:  has No Known Allergies.  MEDICATIONS:  Current Outpatient Medications  Medication Sig Dispense Refill  . APIXABAN (ELIQUIS) VTE  STARTER PACK (10MG AND 5MG) Take as directed on package: start with two-93m tablets twice daily for 7 days. On day 8, switch to one-510mtablet twice daily. 74 each 0  . aspirin 81 MG tablet Take 81 mg by mouth daily.    . Multiple Vitamins-Minerals (MULTIVITAMIN WITH MINERALS) tablet Take 1 tablet by mouth daily.    . valACYclovir (VALTREX) 500 MG tablet Take 1 tablet (500 mg total) by mouth daily. 90 tablet 3   No current facility-administered medications for this visit.     PHYSICAL EXAMINATION: ECOG PERFORMANCE STATUS: 0 - Asymptomatic Vitals:   11/30/20 1334  BP: (!) 144/94  Pulse: 63  Resp: 16  Temp: (!) 97.5 F (36.4 C)  SpO2: 98%   Filed Weights   11/30/20 1334  Weight: 218 lb 6.4 oz (99.1 kg)    Physical Exam Constitutional:      General: He is not in acute distress. HENT:     Head: Normocephalic and atraumatic.  Eyes:     General: No scleral icterus. Cardiovascular:     Rate and Rhythm: Normal rate and regular rhythm.     Heart sounds: Normal heart sounds.  Pulmonary:     Effort: Pulmonary effort is normal. No respiratory distress.     Breath sounds: No wheezing.  Abdominal:     General: Bowel sounds are normal. There is no distension.     Palpations: Abdomen is soft.  Musculoskeletal:        General: No deformity. Normal range of motion.     Cervical back: Normal range of motion and neck supple.  Skin:    General: Skin is warm and dry.     Findings: No erythema or rash.  Neurological:     Mental Status: He is alert and oriented to person, place, and time. Mental status is at baseline.     Cranial Nerves: No cranial nerve deficit.     Coordination: Coordination normal.  Psychiatric:        Mood and Affect: Mood normal.     LABORATORY DATA:  I have reviewed the data as listed Lab Results  Component Value Date   WBC 4.7 02/01/2020   HGB 13.8 02/01/2020   HCT 40.8 02/01/2020   MCV 95.5 02/01/2020   PLT 252.0 02/01/2020   Recent Labs     02/01/20 1056  NA 137  K 4.6  CL 98  CO2 32  GLUCOSE 90  BUN 17  CREATININE 1.01  CALCIUM 9.9   Iron/TIBC/Ferritin/ %Sat No results found for: IRON, TIBC, FERRITIN, IRONPCTSAT    RADIOGRAPHIC STUDIES: I have personally reviewed the radiological images as listed and agreed with the findings in the report. [place]enous Img Lower Unilateral Left  Result Date: 11/27/2020 CLINICAL DATA:  Lower extremity edema EXAM: LEFT LOWER EXTREMITY VENOUS DUPLEX ULTRASOUND TECHNIQUE: Gray-scale sonography with graded compression, as well as color Doppler and duplex ultrasound were performed to evaluate the left lower extremity deep venous system from the level of the common femoral  vein and including the common femoral, femoral, profunda femoral, popliteal and calf veins including the posterior tibial, peroneal and gastrocnemius veins when visible. The superficial great saphenous vein was also interrogated. Spectral Doppler was utilized to evaluate flow at rest and with distal augmentation maneuvers in the common femoral, femoral and popliteal veins. COMPARISON:  None. FINDINGS: Contralateral Common Femoral Vein: Respiratory phasicity is normal and symmetric with the symptomatic side. No evidence of thrombus. Normal compressibility. Common Femoral Vein: No evidence of thrombus. Normal compressibility, respiratory phasicity and response to augmentation. Saphenofemoral Junction: No evidence of thrombus. Normal compressibility and flow on color Doppler imaging. Profunda Femoral Vein: No evidence of thrombus. Normal compressibility and flow on color Doppler imaging. Femoral Vein: There is acute appearing deep venous thrombosis in the distal left femoral vein with loss of compression and augmentation. No appreciable flow seen in the distal aspect of this vessel. Popliteal Vein: There is acute appearing deep venous thrombosis throughout the left popliteal vein without appreciable flow. No compression or augmentation. Calf  Veins: Acute appearing deep venous thrombosis throughout calf veins. No appreciable flow seen. No compression or augmentation. Superficial Great Saphenous Vein: No evidence of thrombus. Normal compressibility. Venous Reflux:  None. Other Findings:  None. IMPRESSION: Acute appearing deep venous thrombosis in the distal left femoral vein, essentially the entire popliteal vein, and calf vein regions. Venous structures elsewhere appear patent on the left. Right common femoral vein patent. These results will be called to the ordering clinician or representative by the Radiologist Assistant, and communication documented in the PACS or [employer]. Electronically Signed   By: Lowella Grip III M.D.   On: 11/27/2020 15:29      ASSESSMENT & PLAN:  1. Acute deep vein thrombosis (DVT) of femoral vein of left lower extremity (HCC)   2. Shortness of breath   3. Family history of breast cancer   4. Lung nodules    #Agree with anticoagulation with Eliquis 10 mg twice daily, finish 7 days course and then followed with 5 mg twice daily. Unprovoked.  Discussed about 3 to 6 months of anticoagulation followed by anticoagulation prophylaxis with Eliquis 2.5 mg twice daily. Hypercoagulable work-up In acute setting, avoid checking protein C, S levels. Check factor V Leiden mutation, prothrombin gene mutation, JAK2 mutation with reflex, CBC, CMP.  #Lung nodules 06/07/2015 Patient had a CT scan done which showed multiple pulmonary nodules, largest 8.5 x 4 mm.  There is also irregularity density measuring 13 x 12 x 7 mm anterior in the lingular segment of the left upper lobe.  Most likely due to old trauma scarring.  It was recommended the patient to follow-up with another CT scan in 3 months to ensure stability.  He did have shortness of breath and chest discomfort prior to the presentation of DVT. I will obtain a CT chest PE protocol for further evaluation of underlying PE, as well as follow-up for the lung  nodule that was detected in previous CT.  Patient agrees with the plan.  Family history of prostate cancer.  Check PSA. I recommend patient to talk to primary care provider for age-appropriate screening.  He is overdue for colonoscopy.  Orders Placed This Encounter  Procedures  . CT ANGIO CHEST PE W OR WO CONTRAST    Standing Status:   Future    Standing Expiration Date:   11/30/2021    Order Specific Question:   If indicated for the ordered procedure, I authorize the administration of contrast media per Radiology protocol  Answer:   Yes    Order Specific Question:   Preferred imaging location?    Answer:   [facility]  . Comprehensive metabolic panel    Standing Status:   Future    Number of Occurrences:   1    Standing Expiration Date:   11/30/2021  . CBC with Differential/Platelet    Standing Status:   Future    Number of Occurrences:   1    Standing Expiration Date:   11/30/2021  . Technologist smear review    Standing Status:   Future    Number of Occurrences:   1    Standing Expiration Date:   11/30/2021  . Lactate dehydrogenase    Standing Status:   Future    Number of Occurrences:   1    Standing Expiration Date:   11/30/2021  . PSA    Standing Status:   Future    Number of Occurrences:   1    Standing Expiration Date:   11/30/2021  . JAK2 V617F, w Reflex to CALR/E12/MPL    Standing Status:   Future    Number of Occurrences:   1    Standing Expiration Date:   11/30/2021  . Factor 5 leiden    Standing Status:   Future    Number of Occurrences:   1    Standing Expiration Date:   11/30/2021  . Prothrombin gene mutation    Standing Status:   Future    Number of Occurrences:   1    Standing Expiration Date:   11/30/2021    All questions were answered. The patient knows to call the clinic with any problems questions or concerns.  cc Jearld Fenton, NP    Return of visit: TBD Thank you for this kind referral and the opportunity to participate in the care of this patient.  A copy of today's note is routed to referring provider    Earlie Server, MD, PhD Hematology Oncology [facility] Pager- [phone] 11/30/2020

## 2020-12-06 ENCOUNTER — Telehealth: Payer: Self-pay | Admitting: Oncology

## 2020-12-06 ENCOUNTER — Encounter: Payer: Self-pay | Admitting: Oncology

## 2020-12-06 ENCOUNTER — Encounter: Payer: Self-pay | Admitting: Internal Medicine

## 2020-12-06 NOTE — Telephone Encounter (Signed)
Patient left vm requesting to move his CT appointment scheduled for 4/14 to [facility] because it is cheaper there.  Routing to scheduler for follow up.

## 2020-12-07 ENCOUNTER — Ambulatory Visit
Admission: RE | Admit: 2020-12-07 | Discharge: 2020-12-07 | Disposition: A | Discharge status: Home / Self Care | Payer: BC Managed Care – PPO | Source: Ambulatory Visit | Attending: Oncology | Admitting: Oncology

## 2020-12-07 ENCOUNTER — Other Ambulatory Visit: Payer: Self-pay

## 2020-12-07 DIAGNOSIS — R0602 Shortness of breath: Secondary | ICD-10-CM | POA: Diagnosis not present

## 2020-12-07 LAB — FACTOR 5 LEIDEN

## 2020-12-07 MED ORDER — IOHEXOL 350 MG/ML SOLN
100.0000 mL | Freq: Once | INTRAVENOUS | Status: AC | PRN
  Administered 2020-12-07: 100 mL via INTRAVENOUS

## 2020-12-08 LAB — PROTHROMBIN GENE MUTATION

## 2020-12-11 ENCOUNTER — Telehealth: Payer: Self-pay | Admitting: *Deleted

## 2020-12-11 ENCOUNTER — Encounter: Payer: Self-pay | Admitting: Oncology

## 2020-12-11 NOTE — Telephone Encounter (Signed)
Patient called asking when he will get an appointment to go over his lab and CT results. Please return his call

## 2020-12-14 ENCOUNTER — Encounter: Payer: Self-pay | Admitting: Oncology

## 2020-12-14 ENCOUNTER — Inpatient Hospital Stay (HOSPITAL_BASED_OUTPATIENT_CLINIC_OR_DEPARTMENT_OTHER): Payer: BC Managed Care – PPO | Admitting: Oncology

## 2020-12-14 VITALS — BP 142/92 | HR 57 | Temp 97.8°F | Resp 18 | Wt 222.0 lb

## 2020-12-14 DIAGNOSIS — Z7982 Long term (current) use of aspirin: Secondary | ICD-10-CM | POA: Diagnosis not present

## 2020-12-14 DIAGNOSIS — R918 Other nonspecific abnormal finding of lung field: Secondary | ICD-10-CM | POA: Diagnosis not present

## 2020-12-14 DIAGNOSIS — Z79899 Other long term (current) drug therapy: Secondary | ICD-10-CM | POA: Diagnosis not present

## 2020-12-14 DIAGNOSIS — D6852 Prothrombin gene mutation: Secondary | ICD-10-CM | POA: Diagnosis not present

## 2020-12-14 DIAGNOSIS — I82412 Acute embolism and thrombosis of left femoral vein: Secondary | ICD-10-CM

## 2020-12-14 DIAGNOSIS — Z8042 Family history of malignant neoplasm of prostate: Secondary | ICD-10-CM | POA: Diagnosis not present

## 2020-12-14 DIAGNOSIS — R0602 Shortness of breath: Secondary | ICD-10-CM | POA: Diagnosis not present

## 2020-12-14 DIAGNOSIS — Z803 Family history of malignant neoplasm of breast: Secondary | ICD-10-CM | POA: Diagnosis not present

## 2020-12-14 DIAGNOSIS — Z7901 Long term (current) use of anticoagulants: Secondary | ICD-10-CM | POA: Diagnosis not present

## 2020-12-14 LAB — CALR + JAK2 E12-15 + MPL (REFLEXED)

## 2020-12-14 LAB — JAK2 V617F, W REFLEX TO CALR/E12/MPL

## 2020-12-14 NOTE — Progress Notes (Signed)
Pt reports that he has been having problem sleeping and believes it may be related to Eliquis. Pt would like to know if he can take advil while on Eliquis.

## 2020-12-14 NOTE — Progress Notes (Addendum)
Hematology/Oncology progress note [facility] Telephone:([phone] [phone]   Patient Care Team: Jearld Fenton, NP as PCP - General (Internal Medicine)  REFERRING PROVIDER: Jearld Fenton, NP  CHIEF COMPLAINTS/REASON FOR VISIT:  acute DVT  HISTORY OF PRESENTING ILLNESS:   Troy Perry is a  55 y.o.  male with PMH listed below was seen in consultation at the request of  Jearld Fenton, NP  for evaluation of acute DVT  Patient reports being very active.  2 weeks ago, he has experienced some shortness of breath with exertion as well as left side chest wall discomfort/pain.  A few days later, he reports tightness of the left lower extremity, some calf discomfort.  Patient was seen by primary care provider on 11/27/2020 and left lower extremity venous ultrasound was obtained.  Study showed Acute appearing deep venous thrombosis in the distal left femoral vein, essentially the entire popliteal vein, and calf vein regions. Venous structures elsewhere appear patent on the left. Right common femoral vein patent.  Patient was started on Eliquis starter kit.  He tolerates well.  Shortness of breath has improved. Denies any previous history of thrombosis, denies any family history of thrombosis.  Grandfather had prostate cancer.  Otherwise no cancer family history. Patient has no  immobilization risk factors.  INTERVAL HISTORY Troy Perry is a 55 y.o. male who has above history reviewed by me today presents for follow up visit for management of acute left lower extremity DVT Problems and complaints are listed below: Patient is on Eliquis.  Reports tolerating well except he has noticed that his sleep is light. No bleeding events.  Left lower extremity swelling/discomfort has improved  Review of Systems  Constitutional: Negative for appetite change, chills, fatigue and fever.  HENT:   Negative for hearing loss and voice change.   Eyes: Negative for eye problems.   Respiratory: Negative for chest tightness and cough.   Cardiovascular: Negative for chest pain.  Gastrointestinal: Negative for abdominal distention, abdominal pain and blood in stool.  Endocrine: Negative for hot flashes.  Genitourinary: Negative for difficulty urinating and frequency.   Musculoskeletal: Negative for arthralgias.  Skin: Negative for itching and rash.  Neurological: Negative for extremity weakness.  Hematological: Negative for adenopathy.  Psychiatric/Behavioral: Negative for confusion.    MEDICAL HISTORY:  Past Medical History:  Diagnosis Date  . Allergy   . Chicken pox   . GERD (gastroesophageal reflux disease)     SURGICAL HISTORY: Past Surgical History:  Procedure Laterality Date  . ANTERIOR CRUCIATE LIGAMENT REPAIR Left   . KNEE ARTHROSCOPY W/ MEDIAL COLLATERAL LIGAMENT (MCL) REPAIR Left   . KNEE CARTILAGE SURGERY Bilateral   . NECK SURGERY  2006   C6, C7  . SHOULDER SURGERY Left    and arm    SOCIAL HISTORY: Social History   Socioeconomic History  . Marital status: Married    Spouse name: Not on file  . Number of children: Not on file  . Years of education: Not on file  . Highest education level: Not on file  Occupational History  . Occupation: IT   Tobacco Use  . Smoking status: Never Smoker  . Smokeless tobacco: Never Used  Substance and Sexual Activity  . Alcohol use: Yes    Alcohol/week: 10.0 standard drinks    Types: 8 Glasses of wine, 2 Standard drinks or equivalent per week    Comment: social--daily  . Drug use: No  . Sexual activity: Yes  Other Topics Concern  . Not on file  Social History Narrative  . Not on file   Social Determinants of Health   Financial Resource Strain: Not on file  Food Insecurity: Not on file  Transportation Needs: Not on file  Physical Activity: Not on file  Stress: Not on file  Social Connections: Not on file  Intimate Partner Violence: Not on file    FAMILY HISTORY: Family History   Problem Relation Age of Onset  . Hyperlipidemia Father   . Hypertension Father   . Diabetes Father   . Arthritis Maternal Grandfather   . Cancer Maternal Grandfather        Prostate  . Diabetes Paternal Grandfather     ALLERGIES:  has No Known Allergies.  MEDICATIONS:  Current Outpatient Medications  Medication Sig Dispense Refill  . APIXABAN (ELIQUIS) VTE STARTER PACK (10MG AND 5MG) Take as directed on package: start with two-51m tablets twice daily for 7 days. On day 8, switch to one-531mtablet twice daily. 74 each 0  . Multiple Vitamins-Minerals (MULTIVITAMIN WITH MINERALS) tablet Take 1 tablet by mouth daily.    . valACYclovir (VALTREX) 500 MG tablet Take 1 tablet (500 mg total) by mouth daily. 90 tablet 3  . aspirin 81 MG tablet Take 81 mg by mouth daily. (Patient not taking: No sig reported)     No current facility-administered medications for this visit.     PHYSICAL EXAMINATION: ECOG PERFORMANCE STATUS: 0 - Asymptomatic Vitals:   12/14/20 0955  BP: (!) 142/92  Pulse: (!) 57  Resp: 18  Temp: 97.8 F (36.6 C)   Filed Weights   12/14/20 0955  Weight: 222 lb (100.7 kg)    Physical Exam Constitutional:      General: He is not in acute distress. HENT:     Head: Normocephalic and atraumatic.  Eyes:     General: No scleral icterus. Cardiovascular:     Rate and Rhythm: Normal rate and regular rhythm.     Heart sounds: Normal heart sounds.  Pulmonary:     Effort: Pulmonary effort is normal. No respiratory distress.     Breath sounds: No wheezing.  Abdominal:     General: Bowel sounds are normal. There is no distension.     Palpations: Abdomen is soft.  Musculoskeletal:        General: No deformity. Normal range of motion.     Cervical back: Normal range of motion and neck supple.  Skin:    General: Skin is warm and dry.     Findings: No erythema or rash.  Neurological:     Mental Status: He is alert and oriented to person, place, and time. Mental status  is at baseline.     Cranial Nerves: No cranial nerve deficit.     Coordination: Coordination normal.  Psychiatric:        Mood and Affect: Mood normal.     LABORATORY DATA:  I have reviewed the data as listed Lab Results  Component Value Date   WBC 5.1 11/30/2020   HGB 14.1 11/30/2020   HCT 41.8 11/30/2020   MCV 93.5 11/30/2020   PLT 332 11/30/2020   Recent Labs    02/01/20 1056 11/30/20 1441  NA 137 139  K 4.6 4.4  CL 98 98  CO2 32 31  GLUCOSE 90 89  BUN 17 14  CREATININE 1.01 0.96  CALCIUM 9.9 9.9  GFRNONAA  --  >60  PROT  --  8.1  ALBUMIN  --  4.2  AST  --  26  ALT  --  34  ALKPHOS  --  58  BILITOT  --  0.5   Iron/TIBC/Ferritin/ %Sat No results found for: IRON, TIBC, FERRITIN, IRONPCTSAT    RADIOGRAPHIC STUDIES: I have personally reviewed the radiological images as listed and agreed with the findings in the report. CT ANGIO CHEST PE W OR WO CONTRAST  Result Date: 12/07/2020 CLINICAL DATA:  History of lower extremity deep venous thrombosis and shortness of breath, initial encounter EXAM: CT ANGIOGRAPHY CHEST WITH CONTRAST TECHNIQUE: Multidetector CT imaging of the chest was performed using the standard protocol during bolus administration of intravenous contrast. Multiplanar CT image reconstructions and MIPs were obtained to evaluate the vascular anatomy. CONTRAST:  120m OMNIPAQUE IOHEXOL 350 MG/ML SOLN COMPARISON:  06/07/2015 FINDINGS: Cardiovascular: Thoracic aorta shows no aneurysmal dilatation or dissection. No cardiac enlargement is seen. Coronary calcifications are noted. The pulmonary artery shows a normal branching pattern bilaterally. No filling defects are identified to suggest pulmonary embolism. Mediastinum/Nodes: Thoracic inlet is within normal limits. No sizable hilar or mediastinal adenopathy is noted. The esophagus as visualized is within normal limits. Lungs/Pleura: Lungs are well aerated bilaterally. Nodular scarring is noted in the lingula stable in  appearance from 2016 consistent with a benign etiology. No acute infiltrate is seen. Stable nodular changes in the right middle lobe are noted dating back to 2016 consistent with a benign etiology. Upper Abdomen: Visualized upper abdomen shows no acute abnormality. Musculoskeletal: Postsurgical changes in the cervical spine are seen. Old rib fractures with nonunion are noted on the left. Review of the MIP images confirms the above findings. IMPRESSION: No evidence of pulmonary embolism. Chronic nodules stable in appearance from 2016 consistent with a benign etiology. No acute abnormality is noted. Electronically Signed   By: MInez CatalinaM.D.   On: 12/07/2020 13:47   [place]Venous Img Lower Unilateral Left  Result Date: 11/27/2020 CLINICAL DATA:  Lower extremity edema EXAM: LEFT LOWER EXTREMITY VENOUS DUPLEX ULTRASOUND TECHNIQUE: Gray-scale sonography with graded compression, as well as color Doppler and duplex ultrasound were performed to evaluate the left lower extremity deep venous system from the level of the common femoral vein and including the common femoral, femoral, profunda femoral, popliteal and calf veins including the posterior tibial, peroneal and gastrocnemius veins when visible. The superficial great saphenous vein was also interrogated. Spectral Doppler was utilized to evaluate flow at rest and with distal augmentation maneuvers in the common femoral, femoral and popliteal veins. COMPARISON:  None. FINDINGS: Contralateral Common Femoral Vein: Respiratory phasicity is normal and symmetric with the symptomatic side. No evidence of thrombus. Normal compressibility. Common Femoral Vein: No evidence of thrombus. Normal compressibility, respiratory phasicity and response to augmentation. Saphenofemoral Junction: No evidence of thrombus. Normal compressibility and flow on color Doppler imaging. Profunda Femoral Vein: No evidence of thrombus. Normal compressibility and flow on color Doppler imaging. Femoral  Vein: There is acute appearing deep venous thrombosis in the distal left femoral vein with loss of compression and augmentation. No appreciable flow seen in the distal aspect of this vessel. Popliteal Vein: There is acute appearing deep venous thrombosis throughout the left popliteal vein without appreciable flow. No compression or augmentation. Calf Veins: Acute appearing deep venous thrombosis throughout calf veins. No appreciable flow seen. No compression or augmentation. Superficial Great Saphenous Vein: No evidence of thrombus. Normal compressibility. Venous Reflux:  None. Other Findings:  None. IMPRESSION: Acute appearing deep venous thrombosis in the distal left femoral vein, essentially the  entire popliteal vein, and calf vein regions. Venous structures elsewhere appear patent on the left. Right common femoral vein patent. These results will be called to the ordering clinician or representative by the Radiologist Assistant, and communication documented in the PACS or [employer]. Electronically Signed   By: Lowella Grip III M.D.   On: 11/27/2020 15:29      ASSESSMENT & PLAN:  1. Acute deep vein thrombosis (DVT) of femoral vein of left lower extremity (HCC)   2. Family history of breast cancer   3. Lung nodules    #Acute left lower extremity DVT Continue Eliquis 5 mg twice daily. Duration of anticoagulation, recommend 3 months. After that, recommend switch to Eliquis 2.5 mg twice daily for prophylactic anticoagulation.  See below We will repeat the left lower extremity ultrasound at that point  #Heterozygous prothrombin gene mutation After he finishes 3 months of anticoagulation, I recommend long-term low-dose Eliquis prophylactic anticoagulation due to the thrombosis event is unprovoked, as well as male gender.  He agrees with the plan.  #Lung nodules Repeat CT chest angio protocol showed no acute pulmonary embolism.  Chronic nodules have been stable since 2016, consistent with  benign etiology.  Family history of prostate cancer.  PSA is 0.71 recommend patient continue follow-up with primary care provider for annual PSA Age-appropriate cancer screening discussed.  Orders Placed This Encounter  Procedures  . US Venous Img Lower Unilateral Left    Standing Status:   Future    Standing Expiration Date:   12/14/2021    Order Specific Question:   Reason for Exam (SYMPTOM  OR DIAGNOSIS REQUIRED)    Answer:   DVT follow up    Order Specific Question:   Preferred imaging location?    Answer:   [facility]  . CBC with Differential/Platelet    Standing Status:   Future    Standing Expiration Date:   12/14/2021  . Comprehensive metabolic panel    Standing Status:   Future    Standing Expiration Date:   12/14/2021    All questions were answered. The patient knows to call the clinic with any problems questions or concerns.  cc Jearld Fenton, NP    Return of visit: 10 to 12 weeks   Earlie Server, MD, PhD Hematology Oncology [facility] at [facility] Pager- [phone] 12/14/2020

## 2020-12-22 ENCOUNTER — Other Ambulatory Visit: Payer: Self-pay | Admitting: Internal Medicine

## 2020-12-25 ENCOUNTER — Encounter: Payer: Self-pay | Admitting: Oncology

## 2020-12-26 ENCOUNTER — Other Ambulatory Visit: Payer: Self-pay | Admitting: Oncology

## 2020-12-26 MED ORDER — ELIQUIS 5 MG PO TABS: 5.0000 mg | ORAL_TABLET | Freq: Two times a day (BID) | ORAL | 3 refills | Status: DC

## 2021-01-14 ENCOUNTER — Encounter: Payer: Self-pay | Admitting: Oncology

## 2021-02-22 ENCOUNTER — Ambulatory Visit
Admission: RE | Admit: 2021-02-22 | Discharge: 2021-02-22 | Disposition: A | Discharge status: Home / Self Care | Payer: BC Managed Care – PPO | Source: Ambulatory Visit | Attending: Oncology | Admitting: Oncology

## 2021-02-22 ENCOUNTER — Other Ambulatory Visit: Payer: Self-pay

## 2021-02-22 DIAGNOSIS — I82412 Acute embolism and thrombosis of left femoral vein: Secondary | ICD-10-CM

## 2021-02-22 DIAGNOSIS — I82442 Acute embolism and thrombosis of left tibial vein: Secondary | ICD-10-CM | POA: Diagnosis not present

## 2021-02-22 DIAGNOSIS — I82432 Acute embolism and thrombosis of left popliteal vein: Secondary | ICD-10-CM | POA: Diagnosis not present

## 2021-02-27 ENCOUNTER — Encounter: Payer: Self-pay | Admitting: Oncology

## 2021-02-27 ENCOUNTER — Inpatient Hospital Stay (HOSPITAL_BASED_OUTPATIENT_CLINIC_OR_DEPARTMENT_OTHER): Payer: BC Managed Care – PPO | Admitting: Oncology

## 2021-02-27 ENCOUNTER — Inpatient Hospital Stay: Payer: BC Managed Care – PPO | Attending: Oncology

## 2021-02-27 ENCOUNTER — Other Ambulatory Visit: Payer: Self-pay

## 2021-02-27 VITALS — BP 143/83 | HR 58 | Temp 99.0°F | Resp 18 | Wt 229.0 lb

## 2021-02-27 DIAGNOSIS — I82412 Acute embolism and thrombosis of left femoral vein: Secondary | ICD-10-CM

## 2021-02-27 DIAGNOSIS — D6852 Prothrombin gene mutation: Secondary | ICD-10-CM | POA: Diagnosis not present

## 2021-02-27 DIAGNOSIS — Z7901 Long term (current) use of anticoagulants: Secondary | ICD-10-CM | POA: Insufficient documentation

## 2021-02-27 LAB — CBC WITH DIFFERENTIAL/PLATELET
Abs Immature Granulocytes: 0.01 10*3/uL (ref 0.00–0.07)
Basophils Absolute: 0.1 10*3/uL (ref 0.0–0.1)
Basophils Relative: 2 %
Eosinophils Absolute: 0.2 10*3/uL (ref 0.0–0.5)
Eosinophils Relative: 6 %
HCT: 41 % (ref 39.0–52.0)
Hemoglobin: 14 g/dL (ref 13.0–17.0)
Immature Granulocytes: 0 %
Lymphocytes Relative: 42 %
Lymphs Abs: 1.7 10*3/uL (ref 0.7–4.0)
MCH: 32 pg (ref 26.0–34.0)
MCHC: 34.1 g/dL (ref 30.0–36.0)
MCV: 93.8 fL (ref 80.0–100.0)
Monocytes Absolute: 0.6 10*3/uL (ref 0.1–1.0)
Monocytes Relative: 15 %
Neutro Abs: 1.4 10*3/uL — ABNORMAL LOW (ref 1.7–7.7)
Neutrophils Relative %: 35 %
Platelets: 244 10*3/uL (ref 150–400)
RBC: 4.37 MIL/uL (ref 4.22–5.81)
RDW: 13.1 % (ref 11.5–15.5)
WBC: 4 10*3/uL (ref 4.0–10.5)
nRBC: 0 % (ref 0.0–0.2)

## 2021-02-27 LAB — COMPREHENSIVE METABOLIC PANEL
ALT: 39 U/L (ref 0–44)
AST: 36 U/L (ref 15–41)
Albumin: 4.3 g/dL (ref 3.5–5.0)
Alkaline Phosphatase: 48 U/L (ref 38–126)
Anion gap: 9 (ref 5–15)
BUN: 14 mg/dL (ref 6–20)
CO2: 26 mmol/L (ref 22–32)
Calcium: 9.1 mg/dL (ref 8.9–10.3)
Chloride: 101 mmol/L (ref 98–111)
Creatinine, Ser: 0.86 mg/dL (ref 0.61–1.24)
GFR, Estimated: >60 mL/min (ref >60)
Glucose, Bld: 125 mg/dL — ABNORMAL HIGH (ref 70–99)
Potassium: 4.2 mmol/L (ref 3.5–5.1)
Sodium: 136 mmol/L (ref 135–145)
Total Bilirubin: 0.6 mg/dL (ref 0.3–1.2)
Total Protein: 7.6 g/dL (ref 6.5–8.1)

## 2021-02-27 MED ORDER — APIXABAN 5 MG PO TABS: 5.0000 mg | ORAL_TABLET | Freq: Two times a day (BID) | ORAL | 0 refills | Status: DC

## 2021-02-27 NOTE — Progress Notes (Signed)
Hematology/Oncology progress note [facility] Telephone:([phone] [phone]   Patient Care Team: Jearld Fenton, NP as PCP - General (Internal Medicine)  REFERRING PROVIDER: Jearld Fenton, NP  CHIEF COMPLAINTS/REASON FOR VISIT:  DVT  HISTORY OF PRESENTING ILLNESS:   Troy Perry is a  55 y.o.  male with PMH listed below was seen in consultation at the request of  Jearld Fenton, NP  for evaluation of acute DVT  Patient reports being very active.  2 weeks ago, he has experienced some shortness of breath with exertion as well as left side chest wall discomfort/pain.  A few days later, he reports tightness of the left lower extremity, some calf discomfort.  Patient was seen by primary care provider on 11/27/2020 and left lower extremity venous ultrasound was obtained.  Study showed Acute appearing deep venous thrombosis in the distal left femoral vein, essentially the entire popliteal vein, and calf vein regions. Venous structures elsewhere appear patent on the left. Right common femoral vein patent.  Patient was started on Eliquis starter kit.  He tolerates well.  Shortness of breath has improved. Denies any previous history of thrombosis, denies any family history of thrombosis.  Grandfather had prostate cancer.  Otherwise no cancer family history. Patient has no  immobilization risk factors.  INTERVAL HISTORY Troy Perry is a 55 y.o. male who has above history reviewed by me today presents for follow up visit for management of acute left lower extremity DVT Problems and complaints are listed below: Patient is on Eliquis.  No bleeding events. He noticed some swelling at the end of the day.  Swelling usually gets better after cycling exercise. No nausea vomiting shortness of breath or chest pain.  Review of Systems  Constitutional:  Negative for appetite change, chills, fatigue and fever.  HENT:   Negative for hearing loss and voice change.   Eyes:   Negative for eye problems.  Respiratory:  Negative for chest tightness and cough.   Cardiovascular:  Negative for chest pain.  Gastrointestinal:  Negative for abdominal distention, abdominal pain and blood in stool.  Endocrine: Negative for hot flashes.  Genitourinary:  Negative for difficulty urinating and frequency.   Musculoskeletal:  Negative for arthralgias.  Skin:  Negative for itching and rash.  Neurological:  Negative for extremity weakness.  Hematological:  Negative for adenopathy.  Psychiatric/Behavioral:  Negative for confusion.    MEDICAL HISTORY:  Past Medical History:  Diagnosis Date   Allergy    Chicken pox    GERD (gastroesophageal reflux disease)     SURGICAL HISTORY: Past Surgical History:  Procedure Laterality Date   ANTERIOR CRUCIATE LIGAMENT REPAIR Left    KNEE ARTHROSCOPY W/ MEDIAL COLLATERAL LIGAMENT (MCL) REPAIR Left    KNEE CARTILAGE SURGERY Bilateral    NECK SURGERY  2006   C6, C7   SHOULDER SURGERY Left    and arm    SOCIAL HISTORY: Social History   Socioeconomic History   Marital status: Single    Spouse name: Not on file   Number of children: Not on file   Years of education: Not on file   Highest education level: Not on file  Occupational History   Occupation: IT   Tobacco Use   Smoking status: Never   Smokeless tobacco: Never  Substance and Sexual Activity   Alcohol use: Yes    Alcohol/week: 10.0 standard drinks    Types: 8 Glasses of wine, 2 Standard drinks or equivalent per week  Comment: social--daily   Drug use: No   Sexual activity: Yes  Other Topics Concern   Not on file  Social History Narrative   Not on file   Social Determinants of Health   Financial Resource Strain: Not on file  Food Insecurity: Not on file  Transportation Needs: Not on file  Physical Activity: Not on file  Stress: Not on file  Social Connections: Not on file  Intimate Partner Violence: Not on file    FAMILY HISTORY: Family History   Problem Relation Age of Onset   Hyperlipidemia Father    Hypertension Father    Diabetes Father    Arthritis Maternal Grandfather    Cancer Maternal Grandfather        Prostate   Diabetes Paternal Grandfather     ALLERGIES:  has No Known Allergies.  MEDICATIONS:  Current Outpatient Medications  Medication Sig Dispense Refill   Multiple Vitamins-Minerals (MULTIVITAMIN WITH MINERALS) tablet Take 1 tablet by mouth daily.     valACYclovir (VALTREX) 500 MG tablet Take 1 tablet (500 mg total) by mouth daily. 90 tablet 3   apixaban (ELIQUIS) 5 MG TABS tablet Take 1 tablet (5 mg total) by mouth 2 (two) times daily. 180 tablet 0   No current facility-administered medications for this visit.     PHYSICAL EXAMINATION: ECOG PERFORMANCE STATUS: 0 - Asymptomatic Vitals:   02/27/21 1013  BP: (!) 143/83  Pulse: (!) 58  Resp: 18  Temp: 99 F (37.2 C)  SpO2: 99%   Filed Weights   02/27/21 1013  Weight: 229 lb (103.9 kg)    Physical Exam Constitutional:      General: He is not in acute distress. HENT:     Head: Normocephalic and atraumatic.  Eyes:     General: No scleral icterus. Cardiovascular:     Rate and Rhythm: Normal rate and regular rhythm.     Heart sounds: Normal heart sounds.  Pulmonary:     Effort: Pulmonary effort is normal. No respiratory distress.     Breath sounds: No wheezing.  Abdominal:     General: Bowel sounds are normal. There is no distension.     Palpations: Abdomen is soft.  Musculoskeletal:        General: No deformity. Normal range of motion.     Cervical back: Normal range of motion and neck supple.  Skin:    General: Skin is warm and dry.     Findings: No erythema or rash.  Neurological:     Mental Status: He is alert and oriented to person, place, and time. Mental status is at baseline.     Cranial Nerves: No cranial nerve deficit.     Coordination: Coordination normal.  Psychiatric:        Mood and Affect: Mood normal.    LABORATORY  DATA:  I have reviewed the data as listed Lab Results  Component Value Date   WBC 4.0 02/27/2021   HGB 14.0 02/27/2021   HCT 41.0 02/27/2021   MCV 93.8 02/27/2021   PLT 244 02/27/2021   Recent Labs    11/30/20 1441 02/27/21 0935  NA 139 136  K 4.4 4.2  CL 98 101  CO2 31 26  GLUCOSE 89 125*  BUN 14 14  CREATININE 0.96 0.86  CALCIUM 9.9 9.1  GFRNONAA >60 >60  PROT 8.1 7.6  ALBUMIN 4.2 4.3  AST 26 36  ALT 34 39  ALKPHOS 58 48  BILITOT 0.5 0.6    Iron/TIBC/Ferritin/ %Sat  No results found for: IRON, TIBC, FERRITIN, IRONPCTSAT    RADIOGRAPHIC STUDIES: I have personally reviewed the radiological images as listed and agreed with the findings in the report. US Venous Img Lower Unilateral Left  Result Date: 02/22/2021 CLINICAL DATA:  History of acute left lower extremity DVT in April EXAM: LEFT LOWER EXTREMITY VENOUS DOPPLER ULTRASOUND TECHNIQUE: Gray-scale sonography with graded compression, as well as color Doppler and duplex ultrasound were performed to evaluate the lower extremity deep venous systems from the level of the common femoral vein and including the common femoral, femoral, profunda femoral, popliteal and calf veins including the posterior tibial, peroneal and gastrocnemius veins when visible. The superficial great saphenous vein was also interrogated. Spectral Doppler was utilized to evaluate flow at rest and with distal augmentation maneuvers in the common femoral, femoral and popliteal veins. COMPARISON:  Prior duplex venous ultrasound 11/27/2020 FINDINGS: Contralateral Common Femoral Vein: Respiratory phasicity is normal and symmetric with the symptomatic side. No evidence of thrombus. Normal compressibility. Common Femoral Vein: No evidence of thrombus. Normal compressibility, respiratory phasicity and response to augmentation. Saphenofemoral Junction: No evidence of thrombus. Normal compressibility and flow on color Doppler imaging. Profunda Femoral Vein: No evidence of  thrombus. Normal compressibility and flow on color Doppler imaging. Femoral Vein: No evidence of thrombus. Normal compressibility, respiratory phasicity and response to augmentation. Popliteal Vein: Persistent poor compressibility of the popliteal vein. Low level echoes fill the popliteal lumen. There is some evidence of trickle flow on color Doppler imaging consistent with partial recanalization. Calf Veins: Persistent occlusive thrombus within the peroneal veins. The posterior tibial veins are clear. Superficial Great Saphenous Vein: No evidence of thrombus. Normal compressibility. Venous Reflux:  None. Other Findings:  None. IMPRESSION: 1. No acute DVT. 2. Subacute, partially recanalized DVT in the popliteal vein. 3. Interval clearing of thrombus from the posterior tibial veins. 4. Persistent thrombus within the peroneal veins. Electronically Signed   By: Jacqulynn Cadet M.D.   On: 02/22/2021 15:32       ASSESSMENT & PLAN:  1. Deep vein thrombosis (DVT) of femoral vein of left lower extremity, unspecified chronicity ([place])   2. Heterozygous for prothrombin [id number] mutation ([place])    #Acute left lower extremity DVT 02/22/2021, Left lower extremity showed improvement of clot burden.  No acute DVT.  There is subacute/partially recanalized DVT in the popliteal vein.  Interval clearing of thrombus from posterior tibial veins.  Persistent thrombus within the peroneal veins. Recommend patient to complete another 3 months of Eliquis 5 mg twice daily.  I will repeat another venous duplex before he returns to the clinic.  #Heterozygous prothrombin gene mutation After he finishes full dose anticoagulation, I recommend long-term low-dose Eliquis prophylactic anticoagulation due to the thrombosis event is unprovoked, as well as male gender.  He agrees with the plan.  #Lung nodules Repeat CT chest angio protocol showed no acute pulmonary embolism.  Chronic nodules have been stable since 2016, consistent with  benign etiology.  Family history of prostate cancer.  PSA is 0.71 recommend patient continue follow-up with primary care provider for annual PSA. Age-appropriate cancer screening discussed with patient.  Recommend colonoscopy.    Orders Placed This Encounter  Procedures   US Venous Img Lower Unilateral Left (DVT)    Standing Status:   Future    Standing Expiration Date:   02/27/2022    Order Specific Question:   Reason for Exam (SYMPTOM  OR DIAGNOSIS REQUIRED)    Answer:   DVT follow up  Order Specific Question:   Preferred imaging location?    Answer:   [facility]   CBC with Differential/Platelet    Standing Status:   Future    Standing Expiration Date:   02/27/2022   Comprehensive metabolic panel    Standing Status:   Future    Standing Expiration Date:   02/27/2022    All questions were answered. The patient knows to call the clinic with any problems questions or concerns.  cc Jearld Fenton, NP    Return of visit: 10 to 12 weeks   Earlie Server, MD, PhD Hematology Oncology [facility] at [facility] Pager- [phone] 02/27/2021

## 2021-02-27 NOTE — Progress Notes (Signed)
Patient here for oncology follow-up appointment, expresses concerns of pressure in groin(both sides)

## 2021-06-05 ENCOUNTER — Ambulatory Visit
Admission: RE | Admit: 2021-06-05 | Discharge: 2021-06-05 | Disposition: A | Discharge status: Home / Self Care | Payer: BC Managed Care – PPO | Source: Ambulatory Visit | Attending: Oncology | Admitting: Oncology

## 2021-06-05 ENCOUNTER — Other Ambulatory Visit: Payer: Self-pay

## 2021-06-05 DIAGNOSIS — I82412 Acute embolism and thrombosis of left femoral vein: Secondary | ICD-10-CM | POA: Insufficient documentation

## 2021-06-05 DIAGNOSIS — I82402 Acute embolism and thrombosis of unspecified deep veins of left lower extremity: Secondary | ICD-10-CM | POA: Diagnosis not present

## 2021-06-05 DIAGNOSIS — I82532 Chronic embolism and thrombosis of left popliteal vein: Secondary | ICD-10-CM | POA: Diagnosis not present

## 2021-06-08 ENCOUNTER — Inpatient Hospital Stay (HOSPITAL_BASED_OUTPATIENT_CLINIC_OR_DEPARTMENT_OTHER): Payer: BC Managed Care – PPO | Admitting: Nurse Practitioner

## 2021-06-08 ENCOUNTER — Other Ambulatory Visit: Payer: Self-pay

## 2021-06-08 ENCOUNTER — Inpatient Hospital Stay: Payer: BC Managed Care – PPO | Attending: Oncology

## 2021-06-08 VITALS — BP 133/96 | HR 62 | Temp 97.9°F | Resp 18 | Wt 224.6 lb

## 2021-06-08 DIAGNOSIS — Z7901 Long term (current) use of anticoagulants: Secondary | ICD-10-CM | POA: Diagnosis not present

## 2021-06-08 DIAGNOSIS — D6852 Prothrombin gene mutation: Secondary | ICD-10-CM | POA: Insufficient documentation

## 2021-06-08 DIAGNOSIS — I82509 Chronic embolism and thrombosis of unspecified deep veins of unspecified lower extremity: Secondary | ICD-10-CM | POA: Insufficient documentation

## 2021-06-08 DIAGNOSIS — I82402 Acute embolism and thrombosis of unspecified deep veins of left lower extremity: Secondary | ICD-10-CM | POA: Diagnosis not present

## 2021-06-08 DIAGNOSIS — I82412 Acute embolism and thrombosis of left femoral vein: Secondary | ICD-10-CM | POA: Diagnosis not present

## 2021-06-08 DIAGNOSIS — Z86718 Personal history of other venous thrombosis and embolism: Secondary | ICD-10-CM | POA: Insufficient documentation

## 2021-06-08 LAB — CBC WITH DIFFERENTIAL/PLATELET
Abs Immature Granulocytes: 0.02 10*3/uL (ref 0.00–0.07)
Basophils Absolute: 0 10*3/uL (ref 0.0–0.1)
Basophils Relative: 1 %
Eosinophils Absolute: 0.2 10*3/uL (ref 0.0–0.5)
Eosinophils Relative: 3 %
HCT: 42.6 % (ref 39.0–52.0)
Hemoglobin: 14.6 g/dL (ref 13.0–17.0)
Immature Granulocytes: 0 %
Lymphocytes Relative: 36 %
Lymphs Abs: 1.8 10*3/uL (ref 0.7–4.0)
MCH: 32.3 pg (ref 26.0–34.0)
MCHC: 34.3 g/dL (ref 30.0–36.0)
MCV: 94.2 fL (ref 80.0–100.0)
Monocytes Absolute: 0.5 10*3/uL (ref 0.1–1.0)
Monocytes Relative: 11 %
Neutro Abs: 2.4 10*3/uL (ref 1.7–7.7)
Neutrophils Relative %: 49 %
Platelets: 260 10*3/uL (ref 150–400)
RBC: 4.52 MIL/uL (ref 4.22–5.81)
RDW: 12.6 % (ref 11.5–15.5)
WBC: 5 10*3/uL (ref 4.0–10.5)
nRBC: 0 % (ref 0.0–0.2)

## 2021-06-08 LAB — COMPREHENSIVE METABOLIC PANEL
ALT: 40 U/L (ref 0–44)
AST: 28 U/L (ref 15–41)
Albumin: 4.3 g/dL (ref 3.5–5.0)
Alkaline Phosphatase: 47 U/L (ref 38–126)
Anion gap: 9 (ref 5–15)
BUN: 16 mg/dL (ref 6–20)
CO2: 28 mmol/L (ref 22–32)
Calcium: 9.2 mg/dL (ref 8.9–10.3)
Chloride: 99 mmol/L (ref 98–111)
Creatinine, Ser: 1.09 mg/dL (ref 0.61–1.24)
GFR, Estimated: >60 mL/min (ref >60)
Glucose, Bld: 129 mg/dL — ABNORMAL HIGH (ref 70–99)
Potassium: 4.2 mmol/L (ref 3.5–5.1)
Sodium: 136 mmol/L (ref 135–145)
Total Bilirubin: 0.6 mg/dL (ref 0.3–1.2)
Total Protein: 7.5 g/dL (ref 6.5–8.1)

## 2021-06-08 MED ORDER — APIXABAN 5 MG PO TABS: 5.0000 mg | ORAL_TABLET | Freq: Two times a day (BID) | ORAL | 0 refills | Status: DC

## 2021-06-08 NOTE — Progress Notes (Signed)
Hematology/Oncology Progress Note [facility] Telephone:([phone] [phone]   Patient Care Team: Jearld Fenton, NP as PCP - General (Internal Medicine) Earlie Server, MD as Consulting Physician (Oncology)  REFERRING PROVIDER: Jearld Fenton, NP   CHIEF COMPLAINTS/REASON FOR VISIT:  DVT  HISTORY OF PRESENTING ILLNESS: Troy Perry is a  55 y.o.  male with PMH listed below was seen in consultation at the request of  Jearld Fenton, NP  for evaluation of acute DVT.   Patient reports being very active.  2 weeks ago, he has experienced some shortness of breath with exertion as well as left side chest wall discomfort/pain.  A few days later, he reports tightness of the left lower extremity, some calf discomfort.  Patient was seen by primary care provider on 11/27/2020 and left lower extremity venous ultrasound was obtained.  Study showed Acute appearing deep venous thrombosis in the distal left femoral vein, essentially the entire popliteal vein, and calf vein regions. Venous structures elsewhere appear patent on the left. Right common femoral vein patent.  Patient was started on Eliquis starter kit.  He tolerates well.  Shortness of breath has improved. Denies any previous history of thrombosis, denies any family history of thrombosis.  Grandfather had prostate cancer.  Otherwise no cancer family history. Patient has no  immobilization risk factors.  INTERVAL HISTORY: Troy Perry is a 55 y.o. male who returns to clinic for labs, follow up, discussion of ultrasound results for hx of left lower extremity DVT. He continues eliquis. Feels well. No abnormal bruising or bleeding. Feels well. No chest  pain or shortness of breath.   Review of Systems  Constitutional:  Negative for appetite change, chills, fatigue and fever.  HENT:   Negative for hearing loss and voice change.   Eyes:  Negative for eye problems.  Respiratory:  Negative for chest tightness and cough.    Cardiovascular:  Negative for chest pain.  Gastrointestinal:  Negative for abdominal distention, abdominal pain and blood in stool.  Endocrine: Negative for hot flashes.  Genitourinary:  Negative for difficulty urinating and frequency.   Musculoskeletal:  Negative for arthralgias.  Skin:  Negative for itching and rash.  Neurological:  Negative for extremity weakness.  Hematological:  Negative for adenopathy.  Psychiatric/Behavioral:  Negative for confusion.    MEDICAL HISTORY:  Past Medical History:  Diagnosis Date   Allergy    Chicken pox    GERD (gastroesophageal reflux disease)     SURGICAL HISTORY: Past Surgical History:  Procedure Laterality Date   ANTERIOR CRUCIATE LIGAMENT REPAIR Left    KNEE ARTHROSCOPY W/ MEDIAL COLLATERAL LIGAMENT (MCL) REPAIR Left    KNEE CARTILAGE SURGERY Bilateral    NECK SURGERY  2006   C6, C7   SHOULDER SURGERY Left    and arm    SOCIAL HISTORY: Social History   Socioeconomic History   Marital status: Single    Spouse name: Not on file   Number of children: Not on file   Years of education: Not on file   Highest education level: Not on file  Occupational History   Occupation: IT   Tobacco Use   Smoking status: Never   Smokeless tobacco: Never  Substance and Sexual Activity   Alcohol use: Yes    Alcohol/week: 10.0 standard drinks    Types: 8 Glasses of wine, 2 Standard drinks or equivalent per week    Comment: social--daily   Drug use: No   Sexual activity: Yes  Other Topics Concern   Not on file  Social History Narrative   Not on file   Social Determinants of Health   Financial Resource Strain: Not on file  Food Insecurity: Not on file  Transportation Needs: Not on file  Physical Activity: Not on file  Stress: Not on file  Social Connections: Not on file  Intimate Partner Violence: Not on file    FAMILY HISTORY: Family History  Problem Relation Age of Onset   Hyperlipidemia Father    Hypertension Father     Diabetes Father    Arthritis Maternal Grandfather    Cancer Maternal Grandfather        Prostate   Diabetes Paternal Grandfather     ALLERGIES:  has No Known Allergies.  MEDICATIONS:  Current Outpatient Medications  Medication Sig Dispense Refill   apixaban (ELIQUIS) 5 MG TABS tablet Take 1 tablet (5 mg total) by mouth 2 (two) times daily. 180 tablet 0   Multiple Vitamins-Minerals (MULTIVITAMIN WITH MINERALS) tablet Take 1 tablet by mouth daily.     valACYclovir (VALTREX) 500 MG tablet Take 1 tablet (500 mg total) by mouth daily. 90 tablet 3   No current facility-administered medications for this visit.     PHYSICAL EXAMINATION: ECOG PERFORMANCE STATUS: 0 - Asymptomatic Vitals:   06/08/21 0945  BP: (!) 133/96  Pulse: 62  Resp: 18  Temp: 97.9 F (36.6 C)  SpO2: 98%   Filed Weights   06/08/21 0945  Weight: 224 lb 9.6 oz (101.9 kg)    Physical Exam Constitutional:      General: He is not in acute distress.    Appearance: He is well-developed.  HENT:     Mouth/Throat:     Pharynx: No oropharyngeal exudate.  Cardiovascular:     Rate and Rhythm: Normal rate and regular rhythm.     Heart sounds: Normal heart sounds.  Pulmonary:     Effort: Pulmonary effort is normal.     Breath sounds: Normal breath sounds. No wheezing.  Abdominal:     General: There is no distension.     Palpations: Abdomen is soft.     Tenderness: There is no abdominal tenderness.  Musculoskeletal:     Cervical back: Normal range of motion and neck supple.     Right lower leg: No edema.     Left lower leg: Edema (minimal) present.  Skin:    General: Skin is warm and dry.     Coloration: Skin is not pale.     Findings: No bruising.  Neurological:     Mental Status: He is alert and oriented to person, place, and time.  Psychiatric:        Mood and Affect: Mood normal.        Behavior: Behavior normal.    LABORATORY DATA:  I have reviewed the data as listed Lab Results  Component Value  Date   WBC 5.0 06/08/2021   HGB 14.6 06/08/2021   HCT 42.6 06/08/2021   MCV 94.2 06/08/2021   PLT 260 06/08/2021   Recent Labs    11/30/20 1441 02/27/21 0935 06/08/21 0927  NA 139 136 136  K 4.4 4.2 4.2  CL 98 101 99  CO2 '31 26 28  ' GLUCOSE 89 125* 129*  BUN '14 14 16  ' CREATININE 0.96 0.86 1.09  CALCIUM 9.9 9.1 9.2  GFRNONAA >60 >60 >60  PROT 8.1 7.6 7.5  ALBUMIN 4.2 4.3 4.3  AST 26 36 28  ALT 34 39 40  ALKPHOS 58 48 47  BILITOT 0.5 0.6 0.6    Iron/TIBC/Ferritin/ %Sat No results found for: IRON, TIBC, FERRITIN, IRONPCTSAT    RADIOGRAPHIC STUDIES: I have personally reviewed the radiological images as listed and agreed with the findings in the report. US Venous Img Lower Unilateral Left (DVT)  Result Date: 06/05/2021 CLINICAL DATA:  Follow-up of left lower extremity DVT. EXAM: LEFT LOWER EXTREMITY VENOUS DOPPLER ULTRASOUND TECHNIQUE: Gray-scale sonography with graded compression, as well as color Doppler and duplex ultrasound were performed to evaluate the lower extremity deep venous systems from the level of the common femoral vein and including the common femoral, femoral, profunda femoral, popliteal and calf veins including the posterior tibial, peroneal and gastrocnemius veins when visible. The superficial great saphenous vein was also interrogated. Spectral Doppler was utilized to evaluate flow at rest and with distal augmentation maneuvers in the common femoral, femoral and popliteal veins. COMPARISON:  11/27/2020 and 02/22/2021 FINDINGS: Contralateral Common Femoral Vein: Respiratory phasicity is normal and symmetric with the symptomatic side. No evidence of thrombus. Normal compressibility. Common Femoral Vein: No evidence of thrombus. Normal compressibility, respiratory phasicity and response to augmentation. Saphenofemoral Junction: No evidence of thrombus. Normal compressibility and flow on color Doppler imaging. Profunda Femoral Vein: No evidence of thrombus. Normal  compressibility and flow on color Doppler imaging. Femoral Vein: No evidence of thrombus. Normal compressibility, respiratory phasicity and response to augmentation. Popliteal Vein: Some residual nonocclusive mural thrombus remains with subjectively improved flow by grayscale and with duplex sampling. Calf Veins: Resolution of peroneal vein thrombus. Visualized posterior tibial and peroneal veins demonstrate normal patency. Normal compressibility and flow on color Doppler imaging. Superficial Great Saphenous Vein: No evidence of thrombus. Normal compressibility. Venous Reflux:  None. Other Findings: No evidence of superficial thrombophlebitis or abnormal fluid collection. IMPRESSION: Improvement since the prior study on 02/22/2021 with improved flow in the left popliteal vein with some residual nonocclusive chronic mural thrombus present and complete resolution of left peroneal vein thrombus. Electronically Signed   By: Aletta Edouard M.D.   On: 06/05/2021 15:23       ASSESSMENT & PLAN:  No diagnosis found.  Acute left lower extremity DVT- 02/22/21, [place] reviewed and showed continued improvement of clot burden but still has residual clot. Continue eliquis Continue eliquis 5 mg twice daily. Plan to repeat venous duplex to assess for resolution prior to next visit. He has minimal symptoms but continue compression for comfort. Physical activity as tolerated is fine.  Prothrombin gene mutation- heterozygous. Plan for long term low dose eliquis prophylaxis due to unprovoked nature of clot an dhis male gender. Lengthy discussion today regarding circumstances around timing of clot and I question covid infection.  Lung nodules- Repeat CT chest angio protocol showed no acute pulmonary embolism.  Chronic nodules have been stable since 2016, consistent with benign etiology. Family history of prostate cancer- PSA is 0.71 recommend patient continue follow-up with primary care provider for annual PSA. Continue cancer  screenings per PCP.   Disposition 3 mo- venous duplex Day to week later see Dr Tasia Catchings wit hcbc, cmp  Orders Placed This Encounter  Procedures   US Venous Img Lower Unilateral Left    Standing Status:   Future    Standing Expiration Date:   06/08/2022    Order Specific Question:   Reason for Exam (SYMPTOM  OR DIAGNOSIS REQUIRED)    Answer:   follow up dvt    Order Specific Question:   Preferred imaging location?    Answer:  [facility]    All questions were answered. The patient knows to call the clinic with any problems questions or concerns.  Beckey Rutter, DNP, AGNP-C [place] [facility] [phone] (clinic)  cc Jearld Fenton, NP

## 2021-06-08 NOTE — Progress Notes (Signed)
Pt has no concerns or complaints at this time.

## 2021-06-09 ENCOUNTER — Other Ambulatory Visit: Payer: Self-pay | Admitting: Oncology

## 2021-06-13 ENCOUNTER — Encounter: Payer: Self-pay | Admitting: Internal Medicine

## 2021-06-13 ENCOUNTER — Ambulatory Visit (INDEPENDENT_AMBULATORY_CARE_PROVIDER_SITE_OTHER): Payer: BC Managed Care – PPO | Admitting: Internal Medicine

## 2021-06-13 ENCOUNTER — Other Ambulatory Visit: Payer: Self-pay

## 2021-06-13 VITALS — BP 133/79 | HR 59 | Temp 97.5°F | Resp 17 | Ht 68.75 in | Wt 228.6 lb

## 2021-06-13 DIAGNOSIS — D6852 Prothrombin gene mutation: Secondary | ICD-10-CM

## 2021-06-13 DIAGNOSIS — R7303 Prediabetes: Secondary | ICD-10-CM

## 2021-06-13 DIAGNOSIS — G4733 Obstructive sleep apnea (adult) (pediatric): Secondary | ICD-10-CM

## 2021-06-13 DIAGNOSIS — I82512 Chronic embolism and thrombosis of left femoral vein: Secondary | ICD-10-CM

## 2021-06-13 DIAGNOSIS — L989 Disorder of the skin and subcutaneous tissue, unspecified: Secondary | ICD-10-CM | POA: Diagnosis not present

## 2021-06-13 DIAGNOSIS — E66811 Obesity, class 1: Secondary | ICD-10-CM

## 2021-06-13 DIAGNOSIS — B009 Herpesviral infection, unspecified: Secondary | ICD-10-CM

## 2021-06-13 DIAGNOSIS — Z6834 Body mass index (BMI) 34.0-34.9, adult: Secondary | ICD-10-CM

## 2021-06-13 DIAGNOSIS — E6609 Other obesity due to excess calories: Secondary | ICD-10-CM

## 2021-06-13 NOTE — Progress Notes (Signed)
Subjective:    Patient ID: Troy Perry, male    DOB: 05-25-1966, 55 y.o.   MRN: [id number]  HPI  Patient presents to clinic today for follow-up of chronic conditions.  OSA: He averages 7 hours of sleep per night without use of his CPAP.  He is using a dental device. Sleep study from 01/2014 reviewed.  Genital Herpes: He denies recent outbreak.  He takes Valacyclovir only as needed.  History of DVT with Heterozygous Prothrombin Mutation: He is on lifelong Eliquis.  He follows with hematology.  Prediabetes: His last A1c was 5.9%, 01/2020.  He is not taking any oral diabetic medication at this time.  He does not check his sugars.  He also reports a sore on his nose.  He noticed this a few years ago.  It seems to come and go.  He reports it will bleed and scab up, resolved and then returned a few months later.  He does report a puncture wound to the right side of his nose secondary to a dog attack in 2008 and is not sure if this is related or not.  He has not put anything OTC on this.  Review of Systems     Past Medical History:  Diagnosis Date   Allergy    Chicken pox    GERD (gastroesophageal reflux disease)     Current Outpatient Medications  Medication Sig Dispense Refill   ELIQUIS 5 MG TABS tablet TAKE 1 TABLET BY MOUTH TWICE A DAY 180 tablet 0   Multiple Vitamins-Minerals (MULTIVITAMIN WITH MINERALS) tablet Take 1 tablet by mouth daily.     valACYclovir (VALTREX) 500 MG tablet Take 1 tablet (500 mg total) by mouth daily. 90 tablet 3   No current facility-administered medications for this visit.    No Known Allergies  Family History  Problem Relation Age of Onset   Hyperlipidemia Father    Hypertension Father    Diabetes Father    Arthritis Maternal Grandfather    Cancer Maternal Grandfather        Prostate   Diabetes Paternal Grandfather     Social History   Socioeconomic History   Marital status: Single    Spouse name: Not on file   Number of children: Not on  file   Years of education: Not on file   Highest education level: Not on file  Occupational History   Occupation: IT   Tobacco Use   Smoking status: Never   Smokeless tobacco: Never  Substance and Sexual Activity   Alcohol use: Yes    Alcohol/week: 10.0 standard drinks    Types: 8 Glasses of wine, 2 Standard drinks or equivalent per week    Comment: social--daily   Drug use: No   Sexual activity: Yes  Other Topics Concern   Not on file  Social History Narrative   Not on file   Social Determinants of Health   Financial Resource Strain: Not on file  Food Insecurity: Not on file  Transportation Needs: Not on file  Physical Activity: Not on file  Stress: Not on file  Social Connections: Not on file  Intimate Partner Violence: Not on file     Constitutional: Denies fever, malaise, fatigue, headache or abrupt weight changes.  HEENT: Denies eye pain, eye redness, ear pain, ringing in the ears, wax buildup, runny nose, nasal congestion, bloody nose, or sore throat. Respiratory: Denies difficulty breathing, shortness of breath, cough or sputum production.   Cardiovascular: Denies chest pain, chest tightness,  palpitations or swelling in the hands or feet.  Gastrointestinal: Denies abdominal pain, bloating, constipation, diarrhea or blood in the stool.  GU: Denies urgency, frequency, pain with urination, burning sensation, blood in urine, odor or discharge. Musculoskeletal: Denies decrease in range of motion, difficulty with gait, muscle pain or joint pain and swelling.  Skin: Patient reports a sore to the right side of his nose and a skin lesion of his scalp.  Denies redness, rashes, or ulcercations.  Neurological: Denies dizziness, difficulty with memory, difficulty with speech or problems with balance and coordination.  Psych: Denies anxiety, depression, SI/HI.  No other specific complaints in a complete review of systems (except as listed in HPI above).  Objective:   Physical  Exam  BP 133/79 (BP Location: Right Arm, Patient Position: Sitting, Cuff Size: Large)   Pulse (!) 59   Temp (!) 97.5 F (36.4 C) (Temporal)   Resp 17   Ht 5' 8.75" (1.746 m)   Wt 228 lb 9.6 oz (103.7 kg)   SpO2 98%   BMI 34.00 kg/m   Wt Readings from Last 3 Encounters:  06/08/21 224 lb 9.6 oz (101.9 kg)  02/27/21 229 lb (103.9 kg)  12/14/20 222 lb (100.7 kg)    General: Appears his stated age, obese, in NAD. Skin: Scabbed lesion noted to right side of nose.  Raised 4 cm fluctuant cyst noted to right posterior scalp. HEENT: Head: normal shape and size; Eyes: EOMs intact;  Cardiovascular: Bradycardic with normal rhythm. S1,S2 noted.  No murmur, rubs or gallops noted. No JVD or BLE edema.  Pulmonary/Chest: Normal effort and positive vesicular breath sounds. No respiratory distress. No wheezes, rales or ronchi noted.  Musculoskeletal:No difficulty with gait.  Neurological: Alert and oriented.  Psychiatric: Mood and affect normal. Behavior is normal. Judgment and thought content normal.    BMET    Component Value Date/Time   NA 136 06/08/2021 0927   K 4.2 06/08/2021 0927   CL 99 06/08/2021 0927   CO2 28 06/08/2021 0927   GLUCOSE 129 (H) 06/08/2021 0927   BUN 16 06/08/2021 0927   CREATININE 1.09 06/08/2021 0927   CALCIUM 9.2 06/08/2021 0927   GFRNONAA >60 06/08/2021 0927    Lipid Panel     Component Value Date/Time   HDL 49 04/25/2008 0000   LDLCALC 59 04/25/2008 0000    CBC    Component Value Date/Time   WBC 5.0 06/08/2021 0927   RBC 4.52 06/08/2021 0927   HGB 14.6 06/08/2021 0927   HCT 42.6 06/08/2021 0927   PLT 260 06/08/2021 0927   MCV 94.2 06/08/2021 0927   MCH 32.3 06/08/2021 0927   MCHC 34.3 06/08/2021 0927   RDW 12.6 06/08/2021 0927   LYMPHSABS 1.8 06/08/2021 0927   MONOABS 0.5 06/08/2021 0927   EOSABS 0.2 06/08/2021 0927   BASOSABS 0.0 06/08/2021 0927    Hgb A1C Lab Results  Component Value Date   HGBA1C 5.9 02/02/2020             Assessment & Plan:   Skin Lesion of Nose, Cyst of Scalp:  Could be concerning for basal cell carcinoma Refer to dermatology for further evaluation and treatment  RTC in 6 months for your annual exam [name], NP This visit occurred during the SARS-CoV-2 public health emergency.  Safety protocols were in place, including screening questions prior to the visit, additional usage of staff PPE, and extensive cleaning of exam room while observing appropriate contact time as indicated for disinfecting solutions.

## 2021-06-14 ENCOUNTER — Encounter: Payer: Self-pay | Admitting: Internal Medicine

## 2021-06-14 DIAGNOSIS — Z6832 Body mass index (BMI) 32.0-32.9, adult: Secondary | ICD-10-CM | POA: Insufficient documentation

## 2021-06-14 DIAGNOSIS — Z6834 Body mass index (BMI) 34.0-34.9, adult: Secondary | ICD-10-CM | POA: Insufficient documentation

## 2021-06-14 DIAGNOSIS — E6609 Other obesity due to excess calories: Secondary | ICD-10-CM | POA: Insufficient documentation

## 2021-06-14 DIAGNOSIS — R7303 Prediabetes: Secondary | ICD-10-CM | POA: Insufficient documentation

## 2021-06-14 LAB — POCT GLYCOSYLATED HEMOGLOBIN (HGB A1C): Hemoglobin A1C: 5.8 % — AB (ref 4.0–5.6)

## 2021-06-14 NOTE — Assessment & Plan Note (Signed)
Encouraged weight loss as this can help reduce sleep apnea symptoms Not using a CPAP but using a dental device

## 2021-06-14 NOTE — Assessment & Plan Note (Signed)
POCT A1c today Encourage low-carb diet and exercise for weight loss

## 2021-06-14 NOTE — Addendum Note (Signed)
Addended by: Wilson Singer on: 06/14/2021 02:48 PM   Modules accepted: Orders

## 2021-06-14 NOTE — Assessment & Plan Note (Signed)
On lifelong Eliquis 

## 2021-06-14 NOTE — Patient Instructions (Signed)
Prediabetes Eating Plan °Prediabetes is a condition that causes blood sugar (glucose) levels to be higher than normal. This increases the risk for developing type 2 diabetes (type 2 diabetes mellitus). Working with a health care provider or nutrition specialist (dietitian) to make diet and lifestyle changes can help prevent the onset of diabetes. These changes may help you: °Control your blood glucose levels. °Improve your cholesterol levels. °Manage your blood pressure. °What are tips for following this plan? °Reading food labels °Read food labels to check the amount of fat, salt (sodium), and sugar in prepackaged foods. Avoid foods that have: °Saturated fats. °Trans fats. °Added sugars. °Avoid foods that have more than 300 milligrams (mg) of sodium per serving. Limit your sodium intake to less than 2,300 mg each day. °Shopping °Avoid buying pre-made and processed foods. °Avoid buying drinks with added sugar. °Cooking °Cook with olive oil. Do not use butter, lard, or ghee. °Bake, broil, grill, steam, or boil foods. Avoid frying. °Meal planning ° °Work with your dietitian to create an eating plan that is right for you. This may include tracking how many calories you take in each day. Use a food diary, notebook, or mobile application to track what you eat at each meal. °Consider following a Mediterranean diet. This includes: °Eating several servings of fresh fruits and vegetables each day. °Eating fish at least twice a week. °Eating one serving each day of whole grains, beans, nuts, and seeds. °Using olive oil instead of other fats. °Limiting alcohol. °Limiting red meat. °Using nonfat or low-fat dairy products. °Consider following a plant-based diet. This includes dietary choices that focus on eating mostly vegetables and fruit, grains, beans, nuts, and seeds. °If you have high blood pressure, you may need to limit your sodium intake or follow a diet such as the DASH (Dietary Approaches to Stop Hypertension) eating  plan. The DASH diet aims to lower high blood pressure. °Lifestyle °Set weight loss goals with help from your health care team. It is recommended that most people with prediabetes lose 7% of their body weight. °Exercise for at least 30 minutes 5 or more days a week. °Attend a support group or seek support from a mental health counselor. °Take over-the-counter and prescription medicines only as told by your health care provider. °What foods are recommended? °Fruits °Berries. Bananas. Apples. Oranges. Grapes. Papaya. Mango. Pomegranate. Kiwi. Grapefruit. Cherries. °Vegetables °Lettuce. Spinach. Peas. Beets. Cauliflower. Cabbage. Broccoli. Carrots. Tomatoes. Squash. Eggplant. Herbs. Peppers. Onions. Cucumbers. Brussels sprouts. °Grains °Whole grains, such as whole-wheat or whole-grain breads, crackers, cereals, and pasta. Unsweetened oatmeal. Bulgur. Barley. Quinoa. Brown rice. Corn or whole-wheat flour tortillas or taco shells. °Meats and other proteins °Seafood. Poultry without skin. Lean cuts of pork and beef. Tofu. Eggs. Nuts. Beans. °Dairy °Low-fat or fat-free dairy products, such as yogurt, cottage cheese, and cheese. °Beverages °Water. Tea. Coffee. Sugar-free or diet soda. Seltzer water. Low-fat or nonfat milk. Milk alternatives, such as soy or almond milk. °Fats and oils °Olive oil. Canola oil. Sunflower oil. Grapeseed oil. Avocado. Walnuts. °Sweets and desserts °Sugar-free or low-fat pudding. Sugar-free or low-fat ice cream and other frozen treats. °Seasonings and condiments °Herbs. Sodium-free spices. Mustard. Relish. Low-salt, low-sugar ketchup. Low-salt, low-sugar barbecue sauce. Low-fat or fat-free mayonnaise. °The items listed above may not be a complete list of recommended foods and beverages. Contact a dietitian for more information. °What foods are not recommended? °Fruits °Fruits canned with syrup. °Vegetables °Canned vegetables. Frozen vegetables with butter or cream sauce. °Grains °Refined white  flour and flour   products, such as bread, pasta, snack foods, and cereals. °Meats and other proteins °Fatty cuts of meat. Poultry with skin. Breaded or fried meat. Processed meats. °Dairy °Full-fat yogurt, cheese, or milk. °Beverages °Sweetened drinks, such as iced tea and soda. °Fats and oils °Butter. Lard. Ghee. °Sweets and desserts °Baked goods, such as cake, cupcakes, pastries, cookies, and cheesecake. °Seasonings and condiments °Spice mixes with added salt. Ketchup. Barbecue sauce. Mayonnaise. °The items listed above may not be a complete list of foods and beverages that are not recommended. Contact a dietitian for more information. °Where to find more information °American Diabetes Association: www.diabetes.org °Summary °You may need to make diet and lifestyle changes to help prevent the onset of diabetes. These changes can help you control blood sugar, improve cholesterol levels, and manage blood pressure. °Set weight loss goals with help from your health care team. It is recommended that most people with prediabetes lose 7% of their body weight. °Consider following a Mediterranean diet. This includes eating plenty of fresh fruits and vegetables, whole grains, beans, nuts, seeds, fish, and low-fat dairy, and using olive oil instead of other fats. °This information is not intended to replace advice given to you by your health care provider. Make sure you discuss any questions you have with your health care provider. °Document Revised: 11/11/2019 Document Reviewed: 11/11/2019 °Elsevier Patient Education © 2022 [employer] ° °

## 2021-06-14 NOTE — Assessment & Plan Note (Signed)
On lifelong Eliquis He will continue to follow with hematology

## 2021-06-14 NOTE — Assessment & Plan Note (Signed)
Taking Valacyclovir only as needed

## 2021-09-06 ENCOUNTER — Other Ambulatory Visit: Payer: Self-pay

## 2021-09-06 ENCOUNTER — Ambulatory Visit
Admission: RE | Admit: 2021-09-06 | Discharge: 2021-09-06 | Disposition: A | Discharge status: Home / Self Care | Payer: BC Managed Care – PPO | Source: Ambulatory Visit | Attending: Nurse Practitioner | Admitting: Nurse Practitioner

## 2021-09-06 ENCOUNTER — Other Ambulatory Visit: Payer: Self-pay | Admitting: Oncology

## 2021-09-06 ENCOUNTER — Other Ambulatory Visit: Payer: Self-pay | Admitting: *Deleted

## 2021-09-06 DIAGNOSIS — I82412 Acute embolism and thrombosis of left femoral vein: Secondary | ICD-10-CM

## 2021-09-06 DIAGNOSIS — I82402 Acute embolism and thrombosis of unspecified deep veins of left lower extremity: Secondary | ICD-10-CM | POA: Diagnosis not present

## 2021-09-06 DIAGNOSIS — I82432 Acute embolism and thrombosis of left popliteal vein: Secondary | ICD-10-CM | POA: Diagnosis not present

## 2021-09-11 ENCOUNTER — Inpatient Hospital Stay: Payer: BC Managed Care – PPO | Attending: Oncology

## 2021-09-11 ENCOUNTER — Other Ambulatory Visit: Payer: Self-pay

## 2021-09-11 ENCOUNTER — Encounter: Payer: Self-pay | Admitting: Oncology

## 2021-09-11 ENCOUNTER — Inpatient Hospital Stay (HOSPITAL_BASED_OUTPATIENT_CLINIC_OR_DEPARTMENT_OTHER): Payer: BC Managed Care – PPO | Admitting: Oncology

## 2021-09-11 VITALS — BP 153/82 | HR 62 | Temp 97.3°F | Resp 18 | Wt 220.2 lb

## 2021-09-11 DIAGNOSIS — I82512 Chronic embolism and thrombosis of left femoral vein: Secondary | ICD-10-CM | POA: Diagnosis not present

## 2021-09-11 DIAGNOSIS — I87002 Postthrombotic syndrome without complications of left lower extremity: Secondary | ICD-10-CM | POA: Diagnosis not present

## 2021-09-11 DIAGNOSIS — I82412 Acute embolism and thrombosis of left femoral vein: Secondary | ICD-10-CM

## 2021-09-11 DIAGNOSIS — Z8042 Family history of malignant neoplasm of prostate: Secondary | ICD-10-CM | POA: Insufficient documentation

## 2021-09-11 LAB — CBC WITH DIFFERENTIAL/PLATELET
Abs Immature Granulocytes: 0.01 10*3/uL (ref 0.00–0.07)
Basophils Absolute: 0 10*3/uL (ref 0.0–0.1)
Basophils Relative: 1 %
Eosinophils Absolute: 0.1 10*3/uL (ref 0.0–0.5)
Eosinophils Relative: 2 %
HCT: 41.5 % (ref 39.0–52.0)
Hemoglobin: 14.4 g/dL (ref 13.0–17.0)
Immature Granulocytes: 0 %
Lymphocytes Relative: 39 %
Lymphs Abs: 1.7 10*3/uL (ref 0.7–4.0)
MCH: 32.2 pg (ref 26.0–34.0)
MCHC: 34.7 g/dL (ref 30.0–36.0)
MCV: 92.8 fL (ref 80.0–100.0)
Monocytes Absolute: 0.6 10*3/uL (ref 0.1–1.0)
Monocytes Relative: 13 %
Neutro Abs: 2 10*3/uL (ref 1.7–7.7)
Neutrophils Relative %: 45 %
Platelets: 243 10*3/uL (ref 150–400)
RBC: 4.47 MIL/uL (ref 4.22–5.81)
RDW: 12.7 % (ref 11.5–15.5)
WBC: 4.5 10*3/uL (ref 4.0–10.5)
nRBC: 0 % (ref 0.0–0.2)

## 2021-09-11 LAB — COMPREHENSIVE METABOLIC PANEL
ALT: 32 U/L (ref 0–44)
AST: 31 U/L (ref 15–41)
Albumin: 4.6 g/dL (ref 3.5–5.0)
Alkaline Phosphatase: 52 U/L (ref 38–126)
Anion gap: 8 (ref 5–15)
BUN: 18 mg/dL (ref 6–20)
CO2: 28 mmol/L (ref 22–32)
Calcium: 9.4 mg/dL (ref 8.9–10.3)
Chloride: 100 mmol/L (ref 98–111)
Creatinine, Ser: 0.96 mg/dL (ref 0.61–1.24)
GFR, Estimated: >60 mL/min (ref >60)
Glucose, Bld: 95 mg/dL (ref 70–99)
Potassium: 4.2 mmol/L (ref 3.5–5.1)
Sodium: 136 mmol/L (ref 135–145)
Total Bilirubin: 0.5 mg/dL (ref 0.3–1.2)
Total Protein: 7.7 g/dL (ref 6.5–8.1)

## 2021-09-11 MED ORDER — APIXABAN 2.5 MG PO TABS: 2.5000 mg | ORAL_TABLET | Freq: Two times a day (BID) | ORAL | 5 refills | Status: DC

## 2021-09-11 NOTE — Progress Notes (Signed)
Patient here for follow up. Pt reports that he feels tightness to left calf.

## 2021-09-11 NOTE — Progress Notes (Signed)
Hematology/Oncology progress note [phone] [phone]   Patient Care Team: Jearld Fenton, NP as PCP - General (Internal Medicine) Earlie Server, MD as Consulting Physician (Oncology)  REFERRING PROVIDER: Jearld Fenton, NP  CHIEF COMPLAINTS/REASON FOR VISIT:  DVT  HISTORY OF PRESENTING ILLNESS:   Troy Perry is a  56 y.o.  male with PMH listed below was seen in consultation at the request of  Jearld Fenton, NP  for evaluation of acute DVT  Patient reports being very active.  2 weeks ago, he has experienced some shortness of breath with exertion as well as left side chest wall discomfort/pain.  A few days later, he reports tightness of the left lower extremity, some calf discomfort.  Patient was seen by primary care provider on 11/27/2020 and left lower extremity venous ultrasound was obtained.  Study showed Acute appearing deep venous thrombosis in the distal left femoral vein, essentially the entire popliteal vein, and calf vein regions. Venous structures elsewhere appear patent on the left. Right common femoral vein patent.  Patient was started on Eliquis starter kit.  He tolerates well.  Shortness of breath has improved. Denies any previous history of thrombosis, denies any family history of thrombosis.  Grandfather had prostate cancer.  Otherwise no cancer family history. Patient has no  immobilization risk factors.  INTERVAL HISTORY Troy Perry is a 56 y.o. male who has above history reviewed by me today presents for follow up visit for management of acute left lower extremity DVT Patient is on Eliquis 5 mg twice daily.  No bleeding events. Patient wears compression stocking on left lower extremity.  Patient reports sometimes he experiences trace edema, especially around his shin.  Patient is very active.  He continues to feel some discomfort after exercising.  He describes it as a tight this feeling. He also recalls that he has been using menthol topical cream  for relief of muscle cramps prior to discovery of left lower extremity DVT.  He has done some research online and has not find any link between topical menthol and blood clots.  Review of Systems  Constitutional:  Negative for appetite change, chills, fatigue and fever.  HENT:   Negative for hearing loss and voice change.   Eyes:  Negative for eye problems.  Respiratory:  Negative for chest tightness and cough.   Cardiovascular:  Negative for chest pain.  Gastrointestinal:  Negative for abdominal distention, abdominal pain and blood in stool.  Endocrine: Negative for hot flashes.  Genitourinary:  Negative for difficulty urinating and frequency.   Musculoskeletal:  Negative for arthralgias.  Skin:  Negative for itching and rash.  Neurological:  Negative for extremity weakness.  Hematological:  Negative for adenopathy.  Psychiatric/Behavioral:  Negative for confusion.    MEDICAL HISTORY:  Past Medical History:  Diagnosis Date   Allergy    Chicken pox    GERD (gastroesophageal reflux disease)     SURGICAL HISTORY: Past Surgical History:  Procedure Laterality Date   ANTERIOR CRUCIATE LIGAMENT REPAIR Left    KNEE ARTHROSCOPY W/ MEDIAL COLLATERAL LIGAMENT (MCL) REPAIR Left    KNEE CARTILAGE SURGERY Bilateral    NECK SURGERY  2006   C6, C7   SHOULDER SURGERY Left    and arm    SOCIAL HISTORY: Social History   Socioeconomic History   Marital status: Single    Spouse name: Not on file   Number of children: Not on file   Years of education: Not on file  Highest education level: Not on file  Occupational History   Occupation: IT   Tobacco Use   Smoking status: Never   Smokeless tobacco: Never  Vaping Use   Vaping Use: Never used  Substance and Sexual Activity   Alcohol use: Yes    Alcohol/week: 10.0 standard drinks    Types: 8 Glasses of wine, 2 Standard drinks or equivalent per week    Comment: social--daily   Drug use: No   Sexual activity: Yes  Other Topics  Concern   Not on file  Social History Narrative   Not on file   Social Determinants of Health   Financial Resource Strain: Not on file  Food Insecurity: Not on file  Transportation Needs: Not on file  Physical Activity: Not on file  Stress: Not on file  Social Connections: Not on file  Intimate Partner Violence: Not on file    FAMILY HISTORY: Family History  Problem Relation Age of Onset   Hyperlipidemia Father    Hypertension Father    Diabetes Father    Arthritis Maternal Grandfather    Cancer Maternal Grandfather        Prostate   Diabetes Paternal Grandfather     ALLERGIES:  has No Known Allergies.  MEDICATIONS:  Current Outpatient Medications  Medication Sig Dispense Refill   apixaban (ELIQUIS) 2.5 MG TABS tablet Take 1 tablet (2.5 mg total) by mouth 2 (two) times daily. 60 tablet 5   Multiple Vitamins-Minerals (MULTIVITAMIN WITH MINERALS) tablet Take 1 tablet by mouth daily.     valACYclovir (VALTREX) 500 MG tablet Take 1 tablet (500 mg total) by mouth daily. (Patient not taking: Reported on 09/11/2021) 90 tablet 3   No current facility-administered medications for this visit.     PHYSICAL EXAMINATION: ECOG PERFORMANCE STATUS: 0 - Asymptomatic Vitals:   09/11/21 1403  BP: (!) 153/82  Pulse: 62  Resp: 18  Temp: (!) 97.3 F (36.3 C)   Filed Weights   09/11/21 1403  Weight: 220 lb 3.2 oz (99.9 kg)    Physical Exam Constitutional:      General: He is not in acute distress. HENT:     Head: Normocephalic and atraumatic.  Eyes:     General: No scleral icterus. Cardiovascular:     Rate and Rhythm: Normal rate and regular rhythm.     Heart sounds: Normal heart sounds.  Pulmonary:     Effort: Pulmonary effort is normal. No respiratory distress.     Breath sounds: No wheezing.  Abdominal:     General: Bowel sounds are normal. There is no distension.     Palpations: Abdomen is soft.  Musculoskeletal:        General: No deformity. Normal range of  motion.     Cervical back: Normal range of motion and neck supple.  Skin:    General: Skin is warm and dry.     Findings: No erythema or rash.  Neurological:     Mental Status: He is alert and oriented to person, place, and time. Mental status is at baseline.     Cranial Nerves: No cranial nerve deficit.     Coordination: Coordination normal.  Psychiatric:        Mood and Affect: Mood normal.    LABORATORY DATA:  I have reviewed the data as listed Lab Results  Component Value Date   WBC 4.5 09/11/2021   HGB 14.4 09/11/2021   HCT 41.5 09/11/2021   MCV 92.8 09/11/2021   PLT 243 09/11/2021  Recent Labs    02/27/21 0935 06/08/21 0927 09/11/21 1351  NA 136 136 136  K 4.2 4.2 4.2  CL 101 99 100  CO2 _0 GLUCOSE 125* 129* 95  BUN _1 CREATININE 0.86 1.09 0.96  CALCIUM 9.1 9.2 9.4  GFRNONAA >60 >60 >60  PROT 7.6 7.5 7.7  ALBUMIN 4.3 4.3 4.6  AST 36 28 31  ALT 39 40 32  ALKPHOS 48 47 52  BILITOT 0.6 0.6 0.5    Iron/TIBC/Ferritin/ %Sat No results found for: IRON, TIBC, FERRITIN, IRONPCTSAT    RADIOGRAPHIC STUDIES: I have personally reviewed the radiological images as listed and agreed with the findings in the report. US Venous Img Lower Unilateral Left  Result Date: 09/06/2021 CLINICAL DATA:  Follow-up DVT. EXAM: LEFT LOWER EXTREMITY VENOUS DOPPLER ULTRASOUND TECHNIQUE: Gray-scale sonography with compression, as well as color and duplex ultrasound, were performed to evaluate the deep venous system(s) from the level of the common femoral vein through the popliteal and proximal calf veins. COMPARISON:  CT chest, 12/07/2020. Lower extremity venous duplex, 06/05/2021, 02/22/2021 and 11/28/2018. FINDINGS: VENOUS Normal compressibility of the LEFT common femoral and superficial femoral, as well as the visualized calf veins. Visualized portions of profunda femoral vein and great saphenous vein unremarkable. Persistent homogeneously hypoechoic, peripheral  partially-filling defect within the popliteal vein, with incomplete coaptation on compression. See key image. Limited views of the contralateral common femoral vein are unremarkable. OTHER No evidence of superficial thrombophlebitis or abnormal fluid collection. Limitations: none IMPRESSION: Persistent, nonocclusive chronic-appearing DVT within the LEFT popliteal vein. No sonographic evidence of progression or propagation. Michaelle Birks, MD [facility] Electronically Signed   By: Michaelle Birks M.D.   On: 09/06/2021 15:17       ASSESSMENT & PLAN:  1. Chronic deep vein thrombosis (DVT) of femoral vein of left lower extremity (HCC)   2. Post-thrombotic syndrome of left lower extremity    #Chronic left lower extremity DVT 09/06/2020, left lower extremity ultrasound showed persistent nonocclusive chronic appearing DVT within the left popliteal vein. Patient has finished 6 months of Eliquis 5 mg twice daily. I recommend patient to decrease dose to Eliquis 2.5 mg twice daily, continue long-term anticoagulation prophylaxis due to unprovoked nature, male gender, heterozygous prothrombin gene mutation.  Patient agrees with the plan.. I do not think topical menthol cream was the trigger of his DVT.  The tightness of left lower extremity after exercising. I wonder if he has post thrombotic syndrome.  Continue compression stocking.  We discussed about option of referring to vascular surgery for evaluation.  Patient prefers to hold off referral at this point and see how he does.  He will let us know if he wants a vascular surgeon evaluation.  Will refer at that time if so.  #Heterozygous prothrombin gene mutation Low-dose anticoagulation prophylaxis.   Orders Placed This Encounter  Procedures   CBC with Differential/Platelet    Standing Status:   Future    Standing Expiration Date:   09/11/2022   Comprehensive metabolic panel    Standing Status:    Future    Standing Expiration Date:   09/11/2022    All questions were answered. The patient knows to call the clinic with any problems questions or concerns.  cc Jearld Fenton, NP    Return of visit: 6 months  Earlie Server, MD, PhD 09/11/2021

## 2021-11-25 ENCOUNTER — Encounter: Payer: Self-pay | Admitting: Internal Medicine

## 2021-12-03 ENCOUNTER — Ambulatory Visit (INDEPENDENT_AMBULATORY_CARE_PROVIDER_SITE_OTHER): Payer: BC Managed Care – PPO | Admitting: Internal Medicine

## 2021-12-03 ENCOUNTER — Encounter: Payer: Self-pay | Admitting: Internal Medicine

## 2021-12-03 VITALS — BP 128/82 | HR 59 | Temp 97.5°F | Ht 70.0 in | Wt 220.0 lb

## 2021-12-03 DIAGNOSIS — R531 Weakness: Secondary | ICD-10-CM

## 2021-12-03 DIAGNOSIS — R52 Pain, unspecified: Secondary | ICD-10-CM | POA: Diagnosis not present

## 2021-12-03 DIAGNOSIS — Z1211 Encounter for screening for malignant neoplasm of colon: Secondary | ICD-10-CM

## 2021-12-03 DIAGNOSIS — R4189 Other symptoms and signs involving cognitive functions and awareness: Secondary | ICD-10-CM | POA: Diagnosis not present

## 2021-12-03 DIAGNOSIS — Z1159 Encounter for screening for other viral diseases: Secondary | ICD-10-CM

## 2021-12-03 DIAGNOSIS — M7918 Myalgia, other site: Secondary | ICD-10-CM | POA: Diagnosis not present

## 2021-12-03 DIAGNOSIS — Z8616 Personal history of COVID-19: Secondary | ICD-10-CM

## 2021-12-03 DIAGNOSIS — E66811 Obesity, class 1: Secondary | ICD-10-CM

## 2021-12-03 DIAGNOSIS — E6609 Other obesity due to excess calories: Secondary | ICD-10-CM

## 2021-12-03 DIAGNOSIS — Z125 Encounter for screening for malignant neoplasm of prostate: Secondary | ICD-10-CM | POA: Diagnosis not present

## 2021-12-03 DIAGNOSIS — Z0001 Encounter for general adult medical examination with abnormal findings: Secondary | ICD-10-CM | POA: Diagnosis not present

## 2021-12-03 DIAGNOSIS — Z6831 Body mass index (BMI) 31.0-31.9, adult: Secondary | ICD-10-CM

## 2021-12-03 NOTE — Assessment & Plan Note (Signed)
Encouraged diet and exercise for weight loss ?

## 2021-12-03 NOTE — Patient Instructions (Signed)

## 2021-12-03 NOTE — Progress Notes (Signed)
? ?Subjective:  ? ? Patient ID: Troy Perry, male    DOB: Aug 31, 1965, 56 y.o.   MRN: [id number] ? ?HPI ? ?Pt presents to the clinic today for his annual exam. ? ?Flu: never ?Tetanus: > 10 years ago ?Covid: Pfizer x 2 ?Shingrix: never ?PSA screening: 11/2020 ?Colon screening: never ?Vision screening: annually ?Dentist: as needed ? ?Diet: He does eat meat. He consumes fruits and veggies. He tries to avoid fried foods. He drinks mostly water with Crystal Light ?Exercise: Riding his bike multiple days per week ? ?Review of Systems ? ?   ?Past Medical History:  ?Diagnosis Date  ? Allergy   ? Chicken pox   ? GERD (gastroesophageal reflux disease)   ? ? ?Current Outpatient Medications  ?Medication Sig Dispense Refill  ? apixaban (ELIQUIS) 2.5 MG TABS tablet Take 1 tablet (2.5 mg total) by mouth 2 (two) times daily. 60 tablet 5  ? Multiple Vitamins-Minerals (MULTIVITAMIN WITH MINERALS) tablet Take 1 tablet by mouth daily.    ? valACYclovir (VALTREX) 500 MG tablet Take 1 tablet (500 mg total) by mouth daily. (Patient not taking: Reported on 09/11/2021) 90 tablet 3  ? ?No current facility-administered medications for this visit.  ? ? ?No Known Allergies ? ?Family History  ?Problem Relation Age of Onset  ? Hyperlipidemia Father   ? Hypertension Father   ? Diabetes Father   ? Arthritis Maternal Grandfather   ? Cancer Maternal Grandfather   ?     Prostate  ? Diabetes Paternal Grandfather   ? ? ?Social History  ? ?Socioeconomic History  ? Marital status: Single  ?  Spouse name: Not on file  ? Number of children: Not on file  ? Years of education: Not on file  ? Highest education level: Not on file  ?Occupational History  ? Occupation: IT   ?Tobacco Use  ? Smoking status: Never  ? Smokeless tobacco: Never  ?Vaping Use  ? Vaping Use: Never used  ?Substance and Sexual Activity  ? Alcohol use: Yes  ?  Alcohol/week: 10.0 standard drinks  ?  Types: 8 Glasses of wine, 2 Standard drinks or equivalent per week  ?  Comment: social--daily  ?  Drug use: No  ? Sexual activity: Yes  ?Other Topics Concern  ? Not on file  ?Social History Narrative  ? Not on file  ? ?Social Determinants of Health  ? ?Financial Resource Strain: Not on file  ?Food Insecurity: Not on file  ?Transportation Needs: Not on file  ?Physical Activity: Not on file  ?Stress: Not on file  ?Social Connections: Not on file  ?Intimate Partner Violence: Not on file  ? ? ? ?Constitutional: Denies fever, malaise, fatigue, headache or abrupt weight changes.  ?HEENT: Denies eye pain, eye redness, ear pain, ringing in the ears, wax buildup, runny nose, nasal congestion, bloody nose, or sore throat. ?Respiratory: Denies difficulty breathing, shortness of breath, cough or sputum production.   ?Cardiovascular: Denies chest pain, chest tightness, palpitations or swelling in the hands or feet.  ?Gastrointestinal: Denies abdominal pain, bloating, constipation, diarrhea or blood in the stool.  ?GU: Denies urgency, frequency, pain with urination, burning sensation, blood in urine, odor or discharge. ?Musculoskeletal: Pt reports body aches and myalgias. Denies decrease in range of motion, difficulty with gait, or joint swelling.  ?Skin: Denies redness, rashes, lesions or ulcercations.  ?Neurological: Pt reports brain fog. Denies dizziness, difficulty with speech or problems with balance and coordination.  ?Psych: Denies anxiety, depression, SI/HI. ? ?No  other specific complaints in a complete review of systems (except as listed in HPI above). ? ?Objective:  ? Physical Exam ? ?BP 128/82 (BP Location: Right Arm, Patient Position: Sitting, Cuff Size: Large)   Pulse (!) 59   Temp (!) 97.5 ?F (36.4 ?C) (Temporal)   Ht '5\' 10"'  (1.778 m)   Wt 220 lb (99.8 kg)   SpO2 100%   BMI 31.57 kg/m?  ? ?Wt Readings from Last 3 Encounters:  ?09/11/21 220 lb 3.2 oz (99.9 kg)  ?06/13/21 228 lb 9.6 oz (103.7 kg)  ?06/08/21 224 lb 9.6 oz (101.9 kg)  ? ? ?General: Appears his stated age, obese in NAD. ?Skin: Warm, dry and  intact. No rashes, lesions or ulcerations noted. ?HEENT: Head: normal shape and size; Eyes: sclera white, no icterus, conjunctiva pink, PERRLA and EOMs intact; Ears: Tm's gray and intact, normal light reflex; Nose: mucosa pink and moist, septum midline; Throat/Mouth: Teeth present, mucosa pink and moist, no exudate, lesions or ulcerations noted.  ?Neck:  Neck supple, trachea midline. No masses, lumps or thyromegaly present.  ?Cardiovascular: Bradycardic with normal rhythm. S1,S2 noted.  No murmur, rubs or gallops noted. No JVD or BLE edema. No carotid bruits noted. ?Pulmonary/Chest: Normal effort and positive vesicular breath sounds. No respiratory distress. No wheezes, rales or ronchi noted.  ?Abdomen: Normal bowel sounds.  ?Musculoskeletal: Strength 5/5 BUE/BLE.  No difficulty with gait.  ?Neurological: Alert and oriented. Cranial nerves II-XII grossly intact. Coordination normal.  ?Psychiatric: Mood and affect normal. Behavior is normal. Judgment and thought content normal.  ? ? ?BMET ?   ?Component Value Date/Time  ? NA 136 09/11/2021 1351  ? K 4.2 09/11/2021 1351  ? CL 100 09/11/2021 1351  ? CO2 28 09/11/2021 1351  ? GLUCOSE 95 09/11/2021 1351  ? BUN 18 09/11/2021 1351  ? CREATININE 0.96 09/11/2021 1351  ? CALCIUM 9.4 09/11/2021 1351  ? GFRNONAA >60 09/11/2021 1351  ? ? ?Lipid Panel  ?   ?Component Value Date/Time  ? HDL 49 04/25/2008 0000  ? [place] 59 04/25/2008 0000  ? ? ?CBC ?   ?Component Value Date/Time  ? WBC 4.5 09/11/2021 1351  ? RBC 4.47 09/11/2021 1351  ? HGB 14.4 09/11/2021 1351  ? HCT 41.5 09/11/2021 1351  ? PLT 243 09/11/2021 1351  ? MCV 92.8 09/11/2021 1351  ? MCH 32.2 09/11/2021 1351  ? MCHC 34.7 09/11/2021 1351  ? RDW 12.7 09/11/2021 1351  ? LYMPHSABS 1.7 09/11/2021 1351  ? MONOABS 0.6 09/11/2021 1351  ? EOSABS 0.1 09/11/2021 1351  ? BASOSABS 0.0 09/11/2021 1351  ? ? ?Hgb A1C ?Lab Results  ?Component Value Date  ? HGBA1C 5.8 (A) 06/13/2021  ? ? ? ? ? ? ? ?   ?Assessment & Plan:  ? ?Preventative  Health Maintenance: ? ?Encouraged him to get a flu shot in the fall ?He declines tetanus booster ?Encouraged him to get his covid booster ?Discussed Shingrix vaccine, he will check coverage with his insurance company ?Cologuard ordered ?Encouraged him to consume a balanced diet and exercise regimen ?Advised him to see an eye doctor and dentist annually ?Will check CBC, CMET, Lipid, A1c , PSA and Hep C today ? ?History of Covid, Body Aches, Myalgias, Brain Fog: ? ?Will check ESR, CRP, ANA, RF and CK today ? ?RTC in 6 months, follow up chronic conditions ?[name], NP ? ?

## 2021-12-04 LAB — COMPLETE METABOLIC PANEL WITH GFR
AG Ratio: 1.6 (calc) (ref 1.0–2.5)
ALT: 35 U/L (ref 9–46)
AST: 27 U/L (ref 10–35)
Albumin: 4.5 g/dL (ref 3.6–5.1)
Alkaline phosphatase (APISO): 54 U/L (ref 35–144)
BUN: 13 mg/dL (ref 7–25)
CO2: 29 mmol/L (ref 20–32)
Calcium: 9.5 mg/dL (ref 8.6–10.3)
Chloride: 100 mmol/L (ref 98–110)
Creat: 0.83 mg/dL (ref 0.70–1.30)
Globulin: 2.9 g/dL (calc) (ref 1.9–3.7)
Glucose, Bld: 84 mg/dL (ref 65–139)
Potassium: 4.2 mmol/L (ref 3.5–5.3)
Sodium: 140 mmol/L (ref 135–146)
Total Bilirubin: 0.4 mg/dL (ref 0.2–1.2)
Total Protein: 7.4 g/dL (ref 6.1–8.1)
eGFR: 102 mL/min/{1.73_m2} (ref >=60)

## 2021-12-04 LAB — ANA: Anti Nuclear Antibody (ANA): NEGATIVE

## 2021-12-04 LAB — PSA: PSA: 0.51 ng/mL (ref <=4.00)

## 2021-12-04 LAB — SEDIMENTATION RATE: Sed Rate: 14 mm/h (ref 0–20)

## 2021-12-04 LAB — RHEUMATOID FACTOR: Rheumatoid fact SerPl-aCnc: <14 IU/mL (ref <14)

## 2021-12-04 LAB — C-REACTIVE PROTEIN: CRP: 2.4 mg/L (ref <8.0)

## 2021-12-05 ENCOUNTER — Telehealth: Payer: Self-pay

## 2021-12-05 NOTE — Telephone Encounter (Signed)
Pt needs to come in for repeat labs.  Half of his labs were not ordered at his appointment 12/03/2021.  I left a message for him to schedule a lab only visit.  PEC please schedule when he calls back.  ? ?Thanks,  ? ?-Mickel Baas  ?

## 2021-12-05 NOTE — Addendum Note (Signed)
Addended by: Jearld Fenton on: 12/05/2021 07:49 AM ? ? Modules accepted: Orders ? ?

## 2021-12-19 ENCOUNTER — Ambulatory Visit (INDEPENDENT_AMBULATORY_CARE_PROVIDER_SITE_OTHER): Payer: BC Managed Care – PPO | Admitting: Dermatology

## 2021-12-19 DIAGNOSIS — L578 Other skin changes due to chronic exposure to nonionizing radiation: Secondary | ICD-10-CM

## 2021-12-19 DIAGNOSIS — L7211 Pilar cyst: Secondary | ICD-10-CM | POA: Diagnosis not present

## 2021-12-19 DIAGNOSIS — C44311 Basal cell carcinoma of skin of nose: Secondary | ICD-10-CM

## 2021-12-19 DIAGNOSIS — D492 Neoplasm of unspecified behavior of bone, soft tissue, and skin: Secondary | ICD-10-CM

## 2021-12-19 DIAGNOSIS — C4491 Basal cell carcinoma of skin, unspecified: Secondary | ICD-10-CM

## 2021-12-19 HISTORY — DX: Basal cell carcinoma of skin, unspecified: C44.91

## 2021-12-19 NOTE — Patient Instructions (Addendum)
?Biopsy Wound Care Instructions ? ?Leave the original bandage on for 24 hours if possible.  If the bandage becomes soaked or soiled before that time, it is OK to remove it and examine the wound.  A small amount of post-operative bleeding is normal.  If excessive bleeding occurs, remove the bandage, place gauze over the site and apply continuous pressure (no peeking) over the area for 30 minutes. If this does not work, please call our clinic as soon as possible or page your doctor if it is after hours.  ? ?Once a day, cleanse the wound with soap and water. It is fine to shower. If a thick crust develops you may use a Q-tip dipped into dilute hydrogen peroxide (mix 1:1 with water) to dissolve it.  Hydrogen peroxide can slow the healing process, so use it only as needed.   ? ?After washing, apply petroleum jelly (Vaseline) or an antibiotic ointment if your doctor prescribed one for you, followed by a bandage.   ? ?For best healing, the wound should be covered with a layer of ointment at all times. If you are not able to keep the area covered with a bandage to hold the ointment in place, this may mean re-applying the ointment several times a day.  Continue this wound care until the wound has healed and is no longer open.  ? ?Itching and mild discomfort is normal during the healing process. However, if you develop pain or severe itching, please call our office.  ? ?If you have any discomfort, you can take Tylenol (acetaminophen) or ibuprofen as directed on the bottle. (Please do not take these if you have an allergy to them or cannot take them for another reason). ? ?Some redness, tenderness and white or yellow material in the wound is normal healing.  If the area becomes very sore and red, or develops a thick yellow-green material (pus), it may be infected; please notify us.   ? ?If you have stitches, return to clinic as directed to have the stitches removed. You will continue wound care for 2-3 days after the stitches  are removed.  ? ?Wound healing continues for up to one year following surgery. It is not unusual to experience pain in the scar from time to time during the interval.  If the pain becomes severe or the scar thickens, you should notify the office.   ? ?A slight amount of redness in a scar is expected for the first six months.  After six months, the redness will fade and the scar will soften and fade.  The color difference becomes less noticeable with time.  If there are any problems, return for a post-op surgery check at your earliest convenience. ? ?To improve the appearance of the scar, you can use silicone scar gel, cream, or sheets (such as Mederma or Serica) every night for up to one year. These are available over the counter (without a prescription). ? ?Please call our office at [phone] for any questions or concerns. ? ? ?Pre-Operative Instructions ? ?You are scheduled for a surgical procedure at [facility]. We recommend you read the following instructions. If you have any questions or concerns, please call the office at [phone]. ? ?Shower and wash the entire body with soap and water the day of your surgery paying special attention to cleansing at and around the planned surgery site. ? ?Avoid aspirin or aspirin containing products at least fourteen (14) days prior to your surgical procedure and for at least  one week (7 Days) after your surgical procedure. If you take aspirin on a regular basis for heart disease or history of stroke or for any other reason, we may recommend you continue taking aspirin but please notify us if you take this on a regular basis. Aspirin can cause more bleeding to occur during surgery as well as prolonged bleeding and bruising after surgery.  ? ?Avoid other nonsteroidal pain medications at least one week prior to surgery and at least one week prior to your surgery. These include medications such as Ibuprofen (Motrin, Advil and Nuprin), Naprosyn, Voltaren,  Relafen, etc. If medications are used for therapeutic reasons, please inform us as they can cause increased bleeding or prolonged bleeding during and bruising after surgical procedures.  ? ?Please advise [place] if you are taking any "blood thinner" medications such as Coumadin or Dipyridamole or Plavix or similar medications. These cause increased bleeding and prolonged bleeding during procedures and bruising after surgical procedures. We may have to consider discontinuing these medications briefly prior to and shortly after your surgery if safe to do so.  ? ?Please inform us of all medications you are currently taking. All medications that are taken regularly should be taken the day of surgery as you always do. Nevertheless, we need to be informed of what medications you are taking prior to surgery to know whether they will affect the procedure or cause any complications.  ? ?Please inform us of any medication allergies. Also inform us of whether you have allergies to Latex or rubber products or whether you have had any adverse reaction to Lidocaine or Epinephrine. ? ?Please inform us of any prosthetic or artificial body parts such as artificial heart valve, joint replacements, etc., or similar condition that might require preoperative antibiotics.  ? ?We recommend avoidance of alcohol at least two weeks prior to surgery and continued avoidance for at least two weeks after surgery.  ? ?We recommend discontinuation of tobacco smoking at least two weeks prior to surgery and continued abstinence for at least two weeks after surgery. ? ?Do not plan strenuous exercise, strenuous work or strenuous lifting for approximately four weeks after your surgery.  ? ?We request if you are unable to make your scheduled surgical appointment, please call us at least a week in advance or as soon as you are aware of a problem so that we can cancel or reschedule the appointment.  ? ?You MAY TAKE TYLENOL (acetaminophen) for pain as it is not  a blood thinner.  ? ?PLEASE PLAN TO BE IN TOWN FOR TWO WEEKS FOLLOWING SURGERY, THIS IS IMPORTANT SO YOU CAN BE CHECKED FOR DRESSING CHANGES, SUTURE REMOVAL AND TO MONITOR FOR POSSIBLE COMPLICATIONS.  ? ? ? ? ? ?If You Need Anything After Your Visit ? ?If you have any questions or concerns for your doctor, please call our main line at [phone] and press option 4 to reach your doctor's medical assistant. If no one answers, please leave a voicemail as directed and we will return your call as soon as possible. Messages left after 4 pm will be answered the following business day.  ? ?You may also send [place] a message via MyChart. We typically respond to MyChart messages within 1-2 business days. ? ?For prescription refills, please ask your pharmacy to contact our office. Our fax number is [phone]. ? ?If you have an urgent issue when the clinic is closed that cannot wait until the next business day, you can page your doctor at the number  below.   ? ?Please note that while we do our best to be available for urgent issues outside of office hours, we are not available 24/7.  ? ?If you have an urgent issue and are unable to reach [place], you may choose to seek medical care at your doctor's office, retail clinic, urgent care center, or emergency room. ? ?If you have a medical emergency, please immediately call 911 or go to the emergency department. ? ?Pager Numbers ? ?- Dr. Nehemiah Massed: [phone] ? ?- Dr. Laurence Ferrari: [phone] ? ?- Dr. Nicole Kindred: [phone] ? ?In the event of inclement weather, please call our main line at [phone] for an update on the status of any delays or closures. ? ?Dermatology Medication Tips: ?Please keep the boxes that topical medications come in in order to help keep track of the instructions about where and how to use these. Pharmacies typically print the medication instructions only on the boxes and not directly on the medication tubes.  ? ?If your medication is too expensive, please contact  our office at [phone] option 4 or send [place] a message through [place].  ? ?We are unable to tell what your co-pay for medications will be in advance as this is different depending on your insurance covera

## 2021-12-19 NOTE — Progress Notes (Signed)
? ?  New Patient Visit ? ?Subjective  ?Troy Perry is a 56 y.o. male who presents for the following: New Patient (Initial Visit) (Patient concerned with spot at nose that occasionally bleeds. States spot has been there for over 15 years after dog bite. Noticed discoloration in 2016. Troy Perry reports some spots at scalp he would like checked. Reports history of cyst removed in past. /Denies history or family history of cancer. //). ?The patient has spots, moles and lesions to be evaluated, some may be new or changing and the patient has concerns that these could be cancer. ? ?The following portions of the chart were reviewed this encounter and updated as appropriate:  ? Tobacco  Allergies  Meds  Problems  Med Hx  Surg Hx  Fam Hx   ?  ?Review of Systems:  No other skin or systemic complaints except as noted in HPI or Assessment and Plan. ? ?Objective  ?Well appearing patient in no apparent distress; mood and affect are within normal limits. ? ?A focused examination was performed including nose, scalp. Relevant physical exam findings are noted in the Assessment and Plan. ? ?right mid lateral nose ?1 x 0.5 cm crusted papule  ? ? ? ? ?right posterior lateral scalp ?3 cm subcutaneous nodule. ? ?posterior scalp ?1.2 cm subcutaneous nodule.  ? ? ?Assessment & Plan  ?Neoplasm of skin ?right mid lateral nose ? ?Skin / nail biopsy ?Type of biopsy: tangential   ?Informed consent: discussed and consent obtained   ?Timeout: patient name, date of birth, surgical site, and procedure verified   ?Procedure prep:  Patient was prepped and draped in usual sterile fashion ?Prep type:  Isopropyl alcohol ?Anesthesia: the lesion was anesthetized in a standard fashion   ?Anesthetic:  1% lidocaine w/ epinephrine 1-100,000 buffered w/ 8.4% NaHCO3 ?Instrument used: flexible razor blade   ?Hemostasis achieved with: pressure, aluminum chloride and electrodesiccation   ?Outcome: patient tolerated procedure well   ?Post-procedure details:  sterile dressing applied and wound care instructions given   ?Dressing type: petrolatum and bandage   ? ?Specimen 1 - Surgical pathology ?Differential Diagnosis: r/o bcc  ?Check Margins: No ?R/o bcc  ?Hx of trauma due to dog bite 15 years ago ? ?If + for bcc would recommend Mohs surgery  ? ?Pilar cyst (2) ?posterior scalp; right posterior lateral scalp ? ?Benign-appearing. Exam most consistent with a pilar cyst. Discussed that a cyst is a benign growth that can grow over time and sometimes get irritated or inflamed. Recommend observation if it is not bothersome. Discussed option of surgical excision to remove it if it is growing, symptomatic, or other changes noted. Please call for new or changing lesions so they can be evaluated. ? ?Patient deferred scheduling treatment at this time. May discuss in future.  ? ?Actinic Damage ?- chronic, secondary to cumulative UV radiation exposure/sun exposure over time ?- diffuse scaly erythematous macules with underlying dyspigmentation ?- Recommend daily broad spectrum sunscreen SPF 30+ to sun-exposed areas, reapply every 2 hours as needed.  ?- Recommend staying in the shade or wearing long sleeves, sun glasses (UVA+UVB protection) and wide brim hats (4-inch brim around the entire circumference of the hat). ?- Call for new or changing lesions. ? ?Return for 1 week follow up on nose. ?I, Ruthell Rummage, CMA, am acting as scribe for Sarina Ser, MD. ?Documentation: I have reviewed the above documentation for accuracy and completeness, and I agree with the above. ? ?Sarina Ser, MD ? ? ? ?

## 2021-12-25 ENCOUNTER — Encounter: Payer: Self-pay | Admitting: Dermatology

## 2021-12-26 ENCOUNTER — Encounter: Payer: Self-pay | Admitting: Dermatology

## 2021-12-26 ENCOUNTER — Other Ambulatory Visit: Payer: Self-pay

## 2021-12-26 DIAGNOSIS — C44311 Basal cell carcinoma of skin of nose: Secondary | ICD-10-CM

## 2021-12-27 ENCOUNTER — Ambulatory Visit: Payer: BC Managed Care – PPO | Admitting: Dermatology

## 2022-01-06 DIAGNOSIS — Z1211 Encounter for screening for malignant neoplasm of colon: Secondary | ICD-10-CM | POA: Diagnosis not present

## 2022-01-13 LAB — COLOGUARD: COLOGUARD: NEGATIVE

## 2022-01-30 ENCOUNTER — Encounter: Payer: Self-pay | Admitting: Dermatology

## 2022-03-11 ENCOUNTER — Ambulatory Visit: Payer: BC Managed Care – PPO | Admitting: Hospice and Palliative Medicine

## 2022-03-11 ENCOUNTER — Other Ambulatory Visit: Payer: BC Managed Care – PPO

## 2022-03-27 ENCOUNTER — Other Ambulatory Visit: Payer: BC Managed Care – PPO

## 2022-03-27 ENCOUNTER — Ambulatory Visit: Payer: BC Managed Care – PPO | Admitting: Oncology

## 2022-03-29 DIAGNOSIS — C44311 Basal cell carcinoma of skin of nose: Secondary | ICD-10-CM | POA: Diagnosis not present

## 2022-03-29 DIAGNOSIS — Z85828 Personal history of other malignant neoplasm of skin: Secondary | ICD-10-CM | POA: Insufficient documentation

## 2022-03-29 HISTORY — PX: OTHER SURGICAL HISTORY: SHX169

## 2022-04-01 ENCOUNTER — Ambulatory Visit (INDEPENDENT_AMBULATORY_CARE_PROVIDER_SITE_OTHER): Payer: BC Managed Care – PPO | Admitting: Dermatology

## 2022-04-01 DIAGNOSIS — L7211 Pilar cyst: Secondary | ICD-10-CM

## 2022-04-01 DIAGNOSIS — L578 Other skin changes due to chronic exposure to nonionizing radiation: Secondary | ICD-10-CM

## 2022-04-01 DIAGNOSIS — D229 Melanocytic nevi, unspecified: Secondary | ICD-10-CM

## 2022-04-01 DIAGNOSIS — Z1283 Encounter for screening for malignant neoplasm of skin: Secondary | ICD-10-CM | POA: Diagnosis not present

## 2022-04-01 DIAGNOSIS — L821 Other seborrheic keratosis: Secondary | ICD-10-CM

## 2022-04-01 DIAGNOSIS — D1800 Hemangioma unspecified site: Secondary | ICD-10-CM

## 2022-04-01 DIAGNOSIS — Z85828 Personal history of other malignant neoplasm of skin: Secondary | ICD-10-CM

## 2022-04-01 DIAGNOSIS — L814 Other melanin hyperpigmentation: Secondary | ICD-10-CM

## 2022-04-01 NOTE — Progress Notes (Unsigned)
   Follow-Up Visit   Subjective  Troy Perry is a 56 y.o. male who presents for the following: Annual Exam (History of BCC - he just had Mohs with Dr Lacinda Axon 03/29/2022. The patient presents for Total-Body Skin Exam (TBSE) for skin cancer screening and mole check.  The patient has spots, moles and lesions to be evaluated, some may be new or changing and the patient has concerns that these could be cancer./).  The following portions of the chart were reviewed this encounter and updated as appropriate:   Tobacco  Allergies  Meds  Problems  Med Hx  Surg Hx  Fam Hx     Review of Systems:  No other skin or systemic complaints except as noted in HPI or Assessment and Plan.  Objective  Well appearing patient in no apparent distress; mood and affect are within normal limits.  A full examination was performed including scalp, head, eyes, ears, nose, lips, neck, chest, axillae, abdomen, back, buttocks, bilateral upper extremities, bilateral lower extremities, hands, feet, fingers, toes, fingernails, and toenails. All findings within normal limits unless otherwise noted below.  Right mid lat nose Healing mohs excision site  post scalp, post lat scalp Subcutaneous nodule at scalp.    Assessment & Plan   Lentigines - Scattered tan macules - Due to sun exposure - Benign-appearing, observe - Recommend daily broad spectrum sunscreen SPF 30+ to sun-exposed areas, reapply every 2 hours as needed. - Call for any changes  Seborrheic Keratoses - Stuck-on, waxy, tan-brown papules and/or plaques  - Benign-appearing - Discussed benign etiology and prognosis. - Observe - Call for any changes  Melanocytic Nevi - Tan-brown and/or pink-flesh-colored symmetric macules and papules - Benign appearing on exam today - Observation - Call clinic for new or changing moles - Recommend daily use of broad spectrum spf 30+ sunscreen to sun-exposed areas.   Hemangiomas - Red papules - Discussed benign  nature - Observe - Call for any changes  Actinic Damage - Chronic condition, secondary to cumulative UV/sun exposure - diffuse scaly erythematous macules with underlying dyspigmentation - Recommend daily broad spectrum sunscreen SPF 30+ to sun-exposed areas, reapply every 2 hours as needed.  - Staying in the shade or wearing long sleeves, sun glasses (UVA+UVB protection) and wide brim hats (4-inch brim around the entire circumference of the hat) are also recommended for sun protection.  - Call for new or changing lesions.  Skin cancer screening performed today.  History of basal cell carcinoma (BCC) Right mid lat nose Status post Mohs by Dr. Lacinda Axon at [facility] 03/29/2022 [place]. Observe for recurrence. Call clinic for new or changing lesions.  Recommend regular skin exams, daily broad-spectrum spf 30+ sunscreen use, and photoprotection.    Pilar cyst post scalp, post lat scalp Will plan excision (2 separate surgeries)  Return for Surgery cysts of scalp (2 surgeries), 1 year TBSE.  I, Ashok Cordia, CMA, am acting as scribe for Sarina Ser, MD . Documentation: I have reviewed the above documentation for accuracy and completeness, and I agree with the above.  Sarina Ser, MD

## 2022-04-01 NOTE — Patient Instructions (Addendum)
Troy Perry [address] [place] [address]     PRE-OPERATIVE INSTRUCTIONS  We recommend you read the following instructions. If you have any questions or concerns, please call the office at [phone].  Shower and wash the entire body with soap and water the day of your surgery paying special attention to cleansing at and around the planned surgery site. Avoid aspirin or aspirin containing products at least ten (10) days prior to your surgical procedure and for at least one week (7 days) after your surgical procedure. If you take aspirin on a regular basis for heart disease or history of stroke or for any other reason, we may recommend you continue taking aspirin but please notify us if you take this on a regular basis. Aspirin can cause more bleeding to occur during surgery as well as prolonged bleeding and bruising after surgery. Avoid other nonsteroidal pain medications at least one week prior to surgery and at least one week after your surgery. These include medications such as Ibuprofen (Motrin, Advil and Nuprin), Naprosyn, Voltaren, Relafen, etc. If these medications are used for therapeutic reasons, please inform us as they can cause increased bleeding or prolonged bleeding during and bruising after surgical procedures.  Please advise [place] if you are taking any "blood thinner" medications such as Coumadin or Dipyridamole or Plavix or similar medications. These cause increased bleeding and prolonged bleeding during procedures and bruising after surgical procedures. We may have to consider discontinuing these medications briefly prior to and shortly after your surgery if safe to do so. Please inform us of all medications you are currently taking. All medications that are taken regularly should be taken the day of surgery as you always do. Nevertheless, we need to be informed of what medications you are taking prior to surgery to know whether they will affect the procedure or cause any  complications. Please inform us of any medication allergies. Also inform us of whether you have allergies to Latex or rubber products or whether you have had any adverse reaction to Lidocaine or Epinephrine. Please inform us of any prosthetic or artificial body parts such as artificial heart valve, joint replacements, etc., or similar condition that might require preoperative antibiotics. We recommend avoidance of alcohol at least two weeks prior to surgery and continued avoidance for at least two weeks after surgery. We recommend avoidance of tobacco smoking at least two weeks prior to surgery and continued abstinence for at least two weeks after surgery. Do not plan strenuous exercise, strenuous work or strenuous lifting for approximately four weeks after your surgery. We request if you are unable to make your scheduled surgical appointment, please call us at least a week in advance or as soon as you are aware of a problem so that we can cancel or reschedule the appointment. You MAY TAKE TYLENOL (acetaminophen) for pain as it is not a blood thinner. PLEASE PLAN TO BE IN TOWN FOR TWO WEEKS FOLLOWING SURGERY. THIS IS IMPORTANT SO YOU CAN BE CHECKED FOR DRESSING CHANGES, SUTURE REMOVAL AND TO MONITOR FOR POSSIBLE COMPLICATIONS. Due to recent changes in healthcare laws, you may see results of your pathology and/or laboratory studies on MyChart before the doctors have had a chance to review them. We understand that in some cases there may be results that are confusing or concerning to you. Please understand that not all results are received at the same time and often the doctors may need to interpret multiple results in order to provide you with the  best plan of care or course of treatment. Therefore, we ask that you please give [place] 2 business days to thoroughly review all your results before contacting the office for clarification. Should we see a critical lab result, you will be contacted sooner.   If You  Need Anything After Your Visit  If you have any questions or concerns for your doctor, please call our main line at [phone] and press option 4 to reach your doctor's medical assistant. If no one answers, please leave a voicemail as directed and we will return your call as soon as possible. Messages left after 4 pm will be answered the following business day.   You may also send [place] a message via [place]. We typically respond to MyChart messages within 1-2 business days.  For prescription refills, please ask your pharmacy to contact our office. Our fax number is [phone].  If you have an urgent issue when the clinic is closed that cannot wait until the next business day, you can page your doctor at the number below.    Please note that while we do our best to be available for urgent issues outside of office hours, we are not available 24/7.   If you have an urgent issue and are unable to reach [place], you may choose to seek medical care at your doctor's office, retail clinic, urgent care center, or emergency room.  If you have a medical emergency, please immediately call 911 or go to the emergency department.  Pager Numbers  - Dr. Nehemiah Massed: [phone]  - Dr. Laurence Ferrari: [phone]  - Dr. Nicole Kindred: [phone]  In the event of inclement weather, please call our main line at [phone] for an update on the status of any delays or closures.  Dermatology Medication Tips: Please keep the boxes that topical medications come in in order to help keep track of the instructions about where and how to use these. Pharmacies typically print the medication instructions only on the boxes and not directly on the medication tubes.   If your medication is too expensive, please contact our office at [phone] option 4 or send [place] a message through [place].   We are unable to tell what your co-pay for medications will be in advance as this is different depending on your insurance coverage. However,  we may be able to find a substitute medication at lower cost or fill out paperwork to get insurance to cover a needed medication.   If a prior authorization is required to get your medication covered by your insurance company, please allow [place] 1-2 business days to complete this process.  Drug prices often vary depending on where the prescription is filled and some pharmacies may offer cheaper prices.  The website www.goodrx.com contains coupons for medications through different pharmacies. The prices here do not account for what the cost may be with help from insurance (it may be cheaper with your insurance), but the website can give you the price if you did not use any insurance.  - You can print the associated coupon and take it with your prescription to the pharmacy.  - You may also stop by our office during regular business hours and pick up a GoodRx coupon card.  - If you need your prescription sent electronically to a different pharmacy, notify our office through [facility] or by phone at [phone] option 4.     Si Usted Necesita Algo Despus de Su Visita  Tambin puede enviarnos un mensaje a travs de Pharmacist, community.  Por lo general respondemos a los mensajes de MyChart en el transcurso de 1 a 2 das hbiles.  Para renovar recetas, por favor pida a su farmacia que se ponga en contacto con nuestra oficina. [place] Dingwall de fax es [place] [phone].  Si tiene un asunto urgente cuando la clnica est cerrada y que no puede esperar hasta el siguiente da hbil, puede llamar/localizar a su doctor(a) al nmero que aparece a continuacin.   Por favor, tenga en cuenta que aunque hacemos todo lo posible para estar disponibles para asuntos urgentes fuera del horario de [place], no estamos disponibles las 24 horas del da, los 7 das de la [place].   Si tiene un problema urgente y no puede comunicarse con nosotros, puede optar por buscar atencin mdica  en el consultorio de su doctor(a), en una  clnica privada, en un centro de atencin urgente o en una sala de emergencias.  Si tiene Engineering geologist, por favor llame inmediatamente al 911 o vaya a la sala de emergencias.  Nmeros de bper  - Dr. Nehemiah Massed: [phone]  - Dra. Moye: [phone]  - Dra. Nicole Kindred: [phone]  En caso de inclemencias del [place], por favor llame a Johnsie Kindred principal al [phone] para una actualizacin sobre el [place] de cualquier retraso o cierre.  Consejos para la medicacin en dermatologa: Por favor, guarde las cajas en las que vienen los medicamentos de uso tpico para ayudarle a seguir las instrucciones sobre dnde y cmo usarlos. Las farmacias generalmente imprimen las instrucciones del medicamento slo en las cajas y no directamente en los tubos del [place].   Si su medicamento es muy caro, por favor, pngase en contacto con Zigmund Daniel llamando al [phone] y presione la opcin 4 o envenos un mensaje a travs de Pharmacist, community.   No podemos decirle cul ser su copago por los medicamentos por adelantado ya que esto es diferente dependiendo de la cobertura de su seguro. Sin embargo, es posible que podamos encontrar un medicamento sustituto a Electrical engineer un formulario para que el seguro cubra el medicamento que se considera necesario.   Si se requiere una autorizacin previa para que su compaa de seguros [place] su medicamento, por favor permtanos de 1 a 2 das hbiles para completar este proceso.  Los precios de los medicamentos varan con frecuencia dependiendo del Environmental consultant de dnde se surte la receta y alguna farmacias pueden ofrecer precios ms baratos.  El sitio web www.goodrx.com tiene cupones para medicamentos de Airline pilot. Los precios aqu no tienen en cuenta lo que podra costar con la ayuda del seguro (puede ser ms barato con su seguro), pero el sitio web puede darle el precio si no utiliz Research scientist (physical sciences).  - Puede imprimir el cupn correspondiente y  llevarlo con su receta a la farmacia.  - Tambin puede pasar por nuestra oficina durante el horario de atencin regular y Charity fundraiser una tarjeta de cupones de GoodRx.  - Si necesita que su receta se enve electrnicamente a una farmacia diferente, informe a nuestra oficina a travs de MyChart de [place] o por telfono llamando al [phone] y presione la opcin 4.

## 2022-04-02 ENCOUNTER — Encounter: Payer: Self-pay | Admitting: Dermatology

## 2022-04-05 ENCOUNTER — Other Ambulatory Visit: Payer: Self-pay

## 2022-04-05 DIAGNOSIS — Z86718 Personal history of other venous thrombosis and embolism: Secondary | ICD-10-CM

## 2022-04-08 ENCOUNTER — Inpatient Hospital Stay: Payer: BC Managed Care – PPO | Attending: Oncology

## 2022-04-08 ENCOUNTER — Encounter: Payer: Self-pay | Admitting: Oncology

## 2022-04-08 ENCOUNTER — Inpatient Hospital Stay (HOSPITAL_BASED_OUTPATIENT_CLINIC_OR_DEPARTMENT_OTHER): Payer: BC Managed Care – PPO | Admitting: Oncology

## 2022-04-08 VITALS — BP 130/77 | HR 68 | Temp 98.2°F

## 2022-04-08 DIAGNOSIS — Z8042 Family history of malignant neoplasm of prostate: Secondary | ICD-10-CM | POA: Insufficient documentation

## 2022-04-08 DIAGNOSIS — I87002 Postthrombotic syndrome without complications of left lower extremity: Secondary | ICD-10-CM | POA: Diagnosis not present

## 2022-04-08 DIAGNOSIS — D6852 Prothrombin gene mutation: Secondary | ICD-10-CM | POA: Diagnosis not present

## 2022-04-08 DIAGNOSIS — R252 Cramp and spasm: Secondary | ICD-10-CM | POA: Insufficient documentation

## 2022-04-08 DIAGNOSIS — I82512 Chronic embolism and thrombosis of left femoral vein: Secondary | ICD-10-CM

## 2022-04-08 DIAGNOSIS — Z7901 Long term (current) use of anticoagulants: Secondary | ICD-10-CM | POA: Insufficient documentation

## 2022-04-08 DIAGNOSIS — Z86718 Personal history of other venous thrombosis and embolism: Secondary | ICD-10-CM

## 2022-04-08 LAB — CBC WITH DIFFERENTIAL/PLATELET
Abs Immature Granulocytes: 0.02 10*3/uL (ref 0.00–0.07)
Basophils Absolute: 0.1 10*3/uL (ref 0.0–0.1)
Basophils Relative: 1 %
Eosinophils Absolute: 0.2 10*3/uL (ref 0.0–0.5)
Eosinophils Relative: 3 %
HCT: 41.4 % (ref 39.0–52.0)
Hemoglobin: 14.1 g/dL (ref 13.0–17.0)
Immature Granulocytes: 0 %
Lymphocytes Relative: 36 %
Lymphs Abs: 2.1 10*3/uL (ref 0.7–4.0)
MCH: 32 pg (ref 26.0–34.0)
MCHC: 34.1 g/dL (ref 30.0–36.0)
MCV: 93.9 fL (ref 80.0–100.0)
Monocytes Absolute: 0.6 10*3/uL (ref 0.1–1.0)
Monocytes Relative: 11 %
Neutro Abs: 2.8 10*3/uL (ref 1.7–7.7)
Neutrophils Relative %: 49 %
Platelets: 240 10*3/uL (ref 150–400)
RBC: 4.41 MIL/uL (ref 4.22–5.81)
RDW: 12.5 % (ref 11.5–15.5)
WBC: 5.8 10*3/uL (ref 4.0–10.5)
nRBC: 0 % (ref 0.0–0.2)

## 2022-04-08 LAB — COMPREHENSIVE METABOLIC PANEL
ALT: 44 U/L (ref 0–44)
AST: 32 U/L (ref 15–41)
Albumin: 4.2 g/dL (ref 3.5–5.0)
Alkaline Phosphatase: 54 U/L (ref 38–126)
Anion gap: 11 (ref 5–15)
BUN: 18 mg/dL (ref 6–20)
CO2: 27 mmol/L (ref 22–32)
Calcium: 9.5 mg/dL (ref 8.9–10.3)
Chloride: 97 mmol/L — ABNORMAL LOW (ref 98–111)
Creatinine, Ser: 0.93 mg/dL (ref 0.61–1.24)
GFR, Estimated: >60 mL/min (ref >60)
Glucose, Bld: 138 mg/dL — ABNORMAL HIGH (ref 70–99)
Potassium: 4.2 mmol/L (ref 3.5–5.1)
Sodium: 135 mmol/L (ref 135–145)
Total Bilirubin: 0.4 mg/dL (ref 0.3–1.2)
Total Protein: 7.8 g/dL (ref 6.5–8.1)

## 2022-04-08 MED ORDER — APIXABAN 2.5 MG PO TABS: 2.5000 mg | ORAL_TABLET | Freq: Two times a day (BID) | ORAL | 5 refills | Status: DC

## 2022-04-08 NOTE — Progress Notes (Signed)
Hematology/Oncology Progress note [phone] [phone]      Patient Care Team: Jearld Fenton, NP as PCP - General (Internal Medicine) Earlie Server, MD as Consulting Physician (Oncology)  REFERRING PROVIDER: Jearld Fenton, NP  CHIEF COMPLAINTS/REASON FOR VISIT:  DVT  HISTORY OF PRESENTING ILLNESS:   Troy Perry is a  56 y.o.  male with PMH listed below was seen in consultation at the request of  Jearld Fenton, NP  for evaluation of acute DVT  Patient reports being very active.  2 weeks ago, he has experienced some shortness of breath with exertion as well as left side chest wall discomfort/pain.  A few days later, he reports tightness of the left lower extremity, some calf discomfort.  Patient was seen by primary care provider on 11/27/2020 and left lower extremity venous ultrasound was obtained.  Study showed Acute appearing deep venous thrombosis in the distal left femoral vein, essentially the entire popliteal vein, and calf vein regions. Venous structures elsewhere appear patent on the left. Right common femoral vein patent.  Patient was started on Eliquis starter kit.  He tolerates well.  Shortness of breath has improved. Denies any previous history of thrombosis, denies any family history of thrombosis.  Grandfather had prostate cancer.  Otherwise no cancer family history. Patient has no  immobilization risk factors.  INTERVAL HISTORY Troy Perry is a 56 y.o. male who has above history reviewed by me today presents for follow up visit for management of chronic left lower extremity DVT Patient is on Eliquis 2.5 mg twice daily. He tolerates well.  Subjectively he feels better on 2.5 mg twice daily compared to previous 5 mg twice daily. He continues to have trace edema of lower extremities.  He wears compression stocking He remains very active After cycling, he noticed bilateral muscle cramps.  He noticed the frequency of cramps increases after his DVT  episodes.  Review of Systems  Constitutional:  Negative for appetite change, chills, fatigue and fever.  HENT:   Negative for hearing loss and voice change.   Eyes:  Negative for eye problems.  Respiratory:  Negative for chest tightness and cough.   Cardiovascular:  Negative for chest pain.  Gastrointestinal:  Negative for abdominal distention, abdominal pain and blood in stool.  Endocrine: Negative for hot flashes.  Genitourinary:  Negative for difficulty urinating and frequency.   Musculoskeletal:  Negative for arthralgias.       Bilateral lower extremity muscle cramps  Skin:  Negative for itching and rash.  Neurological:  Negative for extremity weakness.  Hematological:  Negative for adenopathy.  Psychiatric/Behavioral:  Negative for confusion.     MEDICAL HISTORY:  Past Medical History:  Diagnosis Date   Allergy    Basal cell carcinoma 12/19/2021   Right mid lateral nose. Nodular pattern. Mohs scheduled.   Chicken pox    GERD (gastroesophageal reflux disease)     SURGICAL HISTORY: Past Surgical History:  Procedure Laterality Date   ANTERIOR CRUCIATE LIGAMENT REPAIR Left    Basal cell carcinoma removal   03/29/2022   Ordered by Dr. Lacinda Axon   KNEE ARTHROSCOPY W/ MEDIAL COLLATERAL LIGAMENT (MCL) REPAIR Left    KNEE CARTILAGE SURGERY Bilateral    NECK SURGERY  08/26/2004   C6, C7   SHOULDER SURGERY Left    and arm    SOCIAL HISTORY: Social History   Socioeconomic History   Marital status: Single    Spouse name: Not on file   Number of  children: Not on file   Years of education: Not on file   Highest education level: Not on file  Occupational History   Occupation: IT   Tobacco Use   Smoking status: Never   Smokeless tobacco: Never  Vaping Use   Vaping Use: Never used  Substance and Sexual Activity   Alcohol use: Yes    Alcohol/week: 10.0 standard drinks of alcohol    Types: 8 Glasses of wine, 2 Standard drinks or equivalent per week    Comment: social--daily    Drug use: No   Sexual activity: Yes  Other Topics Concern   Not on file  Social History Narrative   Not on file   Social Determinants of Health   Financial Resource Strain: Not on file  Food Insecurity: Not on file  Transportation Needs: Not on file  Physical Activity: Not on file  Stress: Not on file  Social Connections: Not on file  Intimate Partner Violence: Not on file    FAMILY HISTORY: Family History  Problem Relation Age of Onset   Hyperlipidemia Father    Hypertension Father    Diabetes Father    Arthritis Maternal Grandfather    Cancer Maternal Grandfather        Prostate   Diabetes Paternal Grandfather     ALLERGIES:  has No Known Allergies.  MEDICATIONS:  Current Outpatient Medications  Medication Sig Dispense Refill   Multiple Vitamins-Minerals (MULTIVITAMIN WITH MINERALS) tablet Take 1 tablet by mouth daily.     apixaban (ELIQUIS) 2.5 MG TABS tablet Take 1 tablet (2.5 mg total) by mouth 2 (two) times daily. 60 tablet 5   valACYclovir (VALTREX) 500 MG tablet Take 1 tablet (500 mg total) by mouth daily. (Patient not taking: Reported on 04/08/2022) 90 tablet 3   No current facility-administered medications for this visit.     PHYSICAL EXAMINATION: ECOG PERFORMANCE STATUS: 0 - Asymptomatic Vitals:   04/08/22 1357  BP: 130/77  Pulse: 68  Temp: 98.2 F (36.8 C)   There were no vitals filed for this visit.   Physical Exam Constitutional:      General: He is not in acute distress. HENT:     Head: Normocephalic and atraumatic.  Eyes:     General: No scleral icterus. Cardiovascular:     Rate and Rhythm: Normal rate and regular rhythm.     Heart sounds: Normal heart sounds.  Pulmonary:     Effort: Pulmonary effort is normal. No respiratory distress.     Breath sounds: No wheezing.  Abdominal:     General: Bowel sounds are normal. There is no distension.     Palpations: Abdomen is soft.  Musculoskeletal:        General: No deformity. Normal  range of motion.     Cervical back: Normal range of motion and neck supple.  Skin:    General: Skin is warm and dry.     Findings: No erythema or rash.  Neurological:     Mental Status: He is alert and oriented to person, place, and time. Mental status is at baseline.     Cranial Nerves: No cranial nerve deficit.     Coordination: Coordination normal.  Psychiatric:        Mood and Affect: Mood normal.     LABORATORY DATA:  I have reviewed the data as listed Lab Results  Component Value Date   WBC 5.8 04/08/2022   HGB 14.1 04/08/2022   HCT 41.4 04/08/2022   MCV 93.9 04/08/2022  PLT 240 04/08/2022   Recent Labs    06/08/21 0927 09/11/21 1351 12/03/21 1516 04/08/22 1331  NA 136 136 140 135  K 4.2 4.2 4.2 4.2  CL 99 100 100 97*  CO2 _0 GLUCOSE 129* 95 84 138*  BUN _1 CREATININE 1.09 0.96 0.83 0.93  CALCIUM 9.2 9.4 9.5 9.5  GFRNONAA >60 >60  --  >60  PROT 7.5 7.7 7.4 7.8  ALBUMIN 4.3 4.6  --  4.2  AST _2 32  ALT 40 32 35 44  ALKPHOS 47 52  --  54  BILITOT 0.6 0.5 0.4 0.4    Iron/TIBC/Ferritin/ %Sat No results found for: "IRON", "TIBC", "FERRITIN", "IRONPCTSAT"    RADIOGRAPHIC STUDIES: I have personally reviewed the radiological images as listed and agreed with the findings in the report. No results found.     ASSESSMENT & PLAN:  1. Chronic deep vein thrombosis (DVT) of femoral vein of left lower extremity (HCC)   2. Post-thrombotic syndrome of left lower extremity   3. Heterozygous for prothrombin G20210A mutation ([place])    #Chronic left lower extremity DVT 09/06/2020, left lower extremity ultrasound showed persistent nonocclusive chronic appearing DVT within the left popliteal vein. Currently on Eliquis 2.5 mg twice daily prophylactically.  He tolerates well. Continue current regimen.  I reviewed Eliquis. Check antiphospholipid syndrome, Antithrombin III, protein C&S levels.   #Cramps after exercising, etiology  unknown. Possible secondary to post thrombotic syndromes.  Continue compression stocking. Recommend patient to establish care with vascular surgery He may have repeat ultrasound at the vascular surgeon's office.  I will hold off repeating ultrasound at this point.  #Heterozygous prothrombin gene mutation Low-dose anticoagulation prophylaxis.   Orders Placed This Encounter  Procedures   CBC with Differential/Platelet    Standing Status:   Future    Standing Expiration Date:   04/09/2023   Comprehensive metabolic panel    Standing Status:   Future    Standing Expiration Date:   04/08/2023   Ambulatory referral to Vascular Surgery    Referral Priority:   Routine    Referral Type:   Surgical    Referral Reason:   Specialty Services Required    Requested Specialty:   Vascular Surgery    Number of Visits Requested:   1    All questions were answered. The patient knows to call the clinic with any problems questions or concerns.  cc Jearld Fenton, NP    Return of visit: 6 months  Earlie Server, MD, PhD 04/08/2022

## 2022-04-08 NOTE — Progress Notes (Signed)
Pt here for follow up. No new concerns voiced.   

## 2022-04-09 ENCOUNTER — Telehealth: Payer: Self-pay

## 2022-04-09 NOTE — Telephone Encounter (Signed)
History and specimen tracking updated per Mohs progress note

## 2022-04-12 ENCOUNTER — Other Ambulatory Visit: Payer: Self-pay

## 2022-04-12 DIAGNOSIS — I82512 Chronic embolism and thrombosis of left femoral vein: Secondary | ICD-10-CM

## 2022-04-15 ENCOUNTER — Inpatient Hospital Stay: Payer: BC Managed Care – PPO

## 2022-04-26 IMAGING — US US EXTREM LOW VENOUS*L*
1 series · 13 of 24 positions shown · non-contrast
Comparison: 11/27/2020 and 02/22/2021

CLINICAL DATA: Follow-up of left lower extremity DVT.



[Series 1: us venous img lower uni left (dvt) · portal-venous · 13 of 34 slices shown]
[im 1/34]
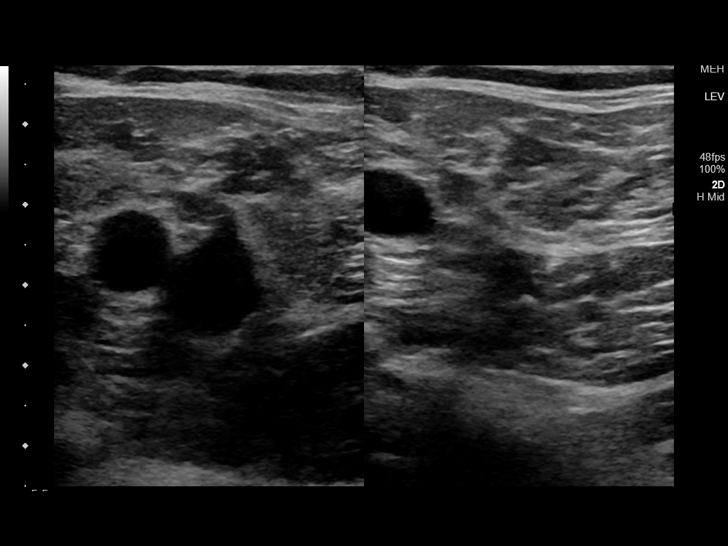
[im 3/34]
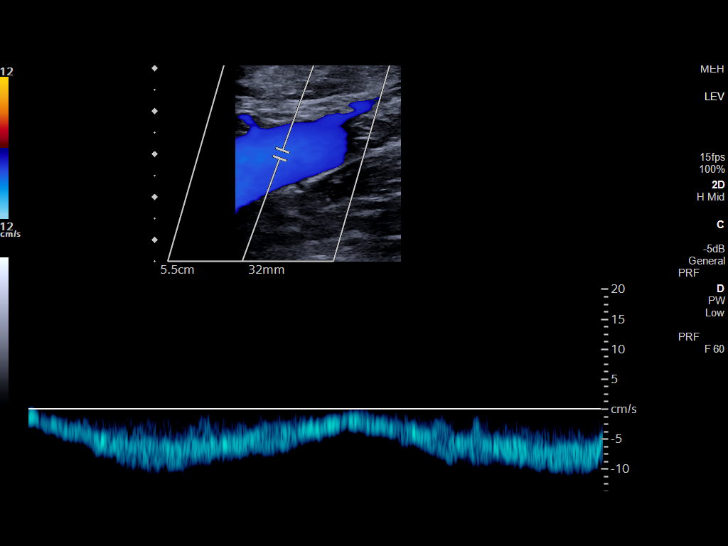
[im 6/34]
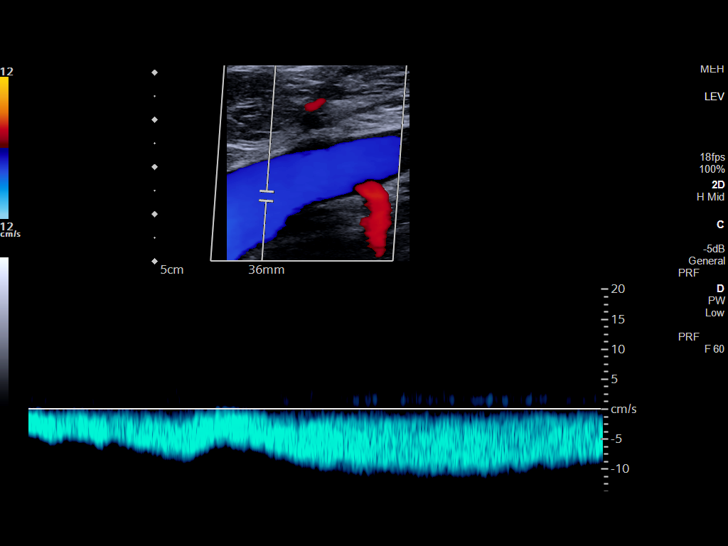
[im 9/34]
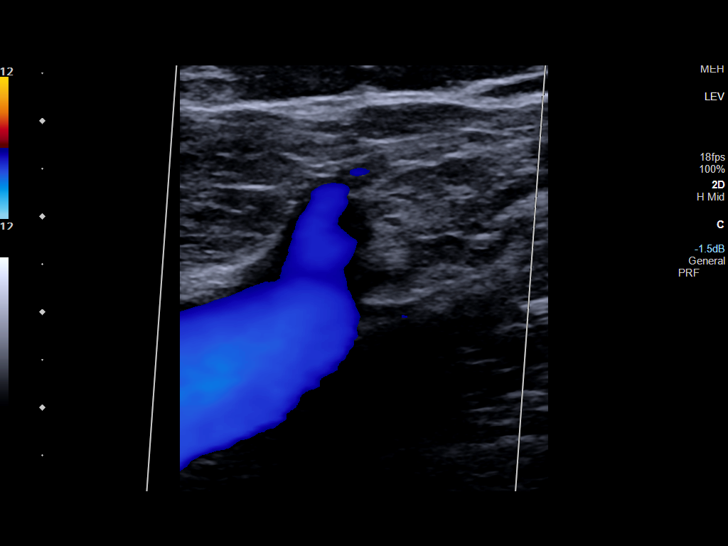
[im 12/34]
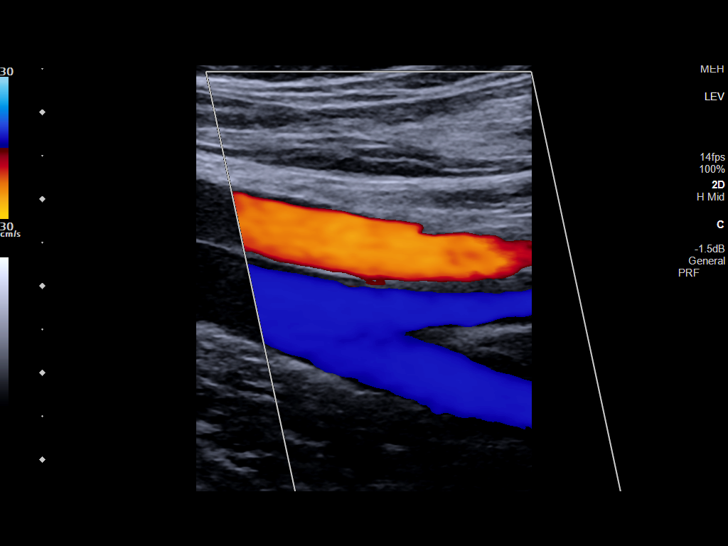
[im 15/34]
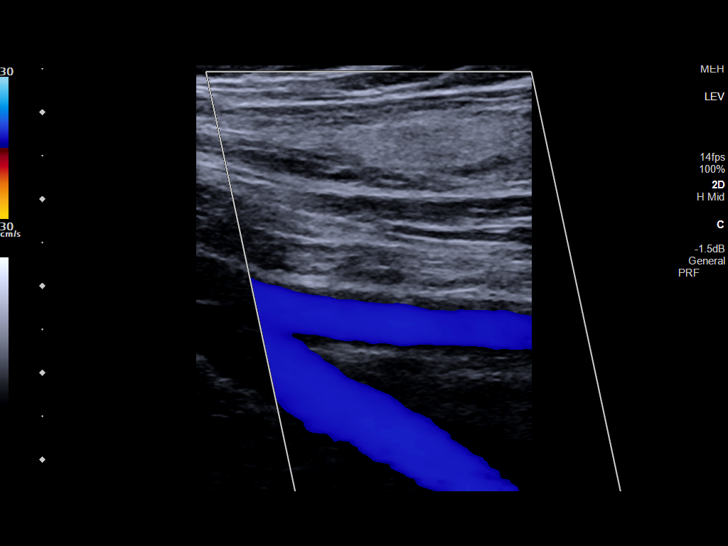
[im 18/34]
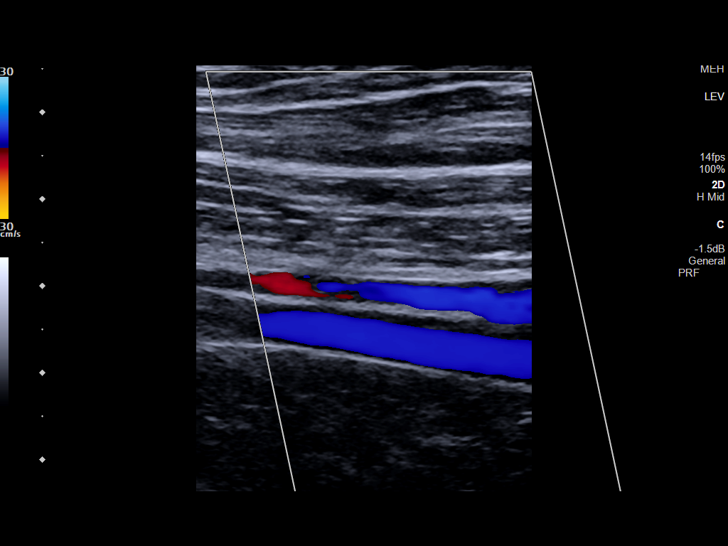
[im 19/34]
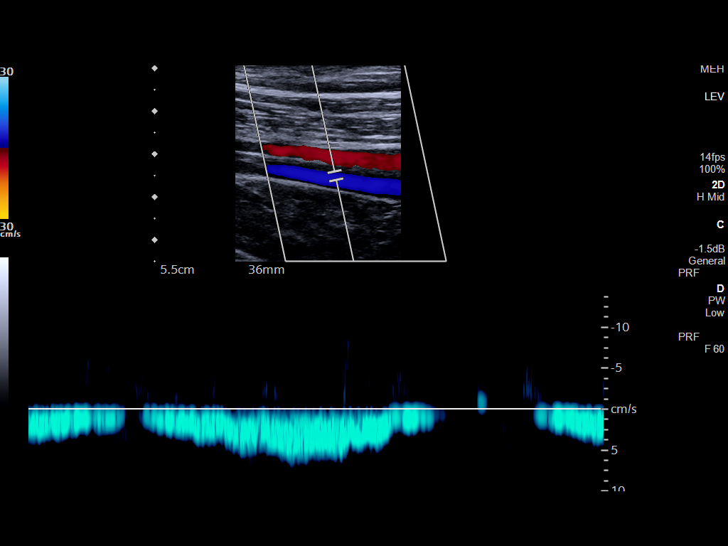
[im 22/34]
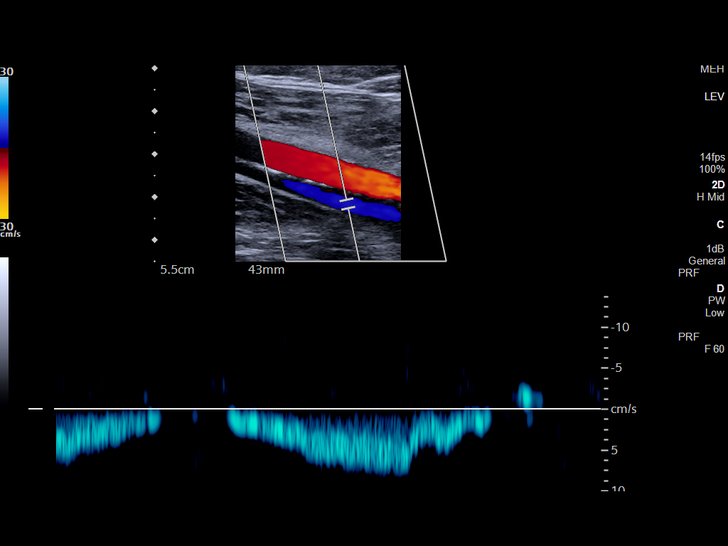
[im 25/34]
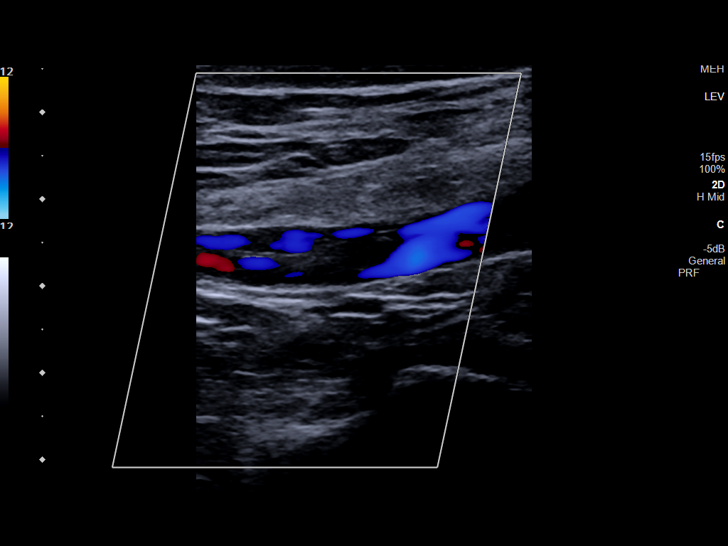
[im 28/34]
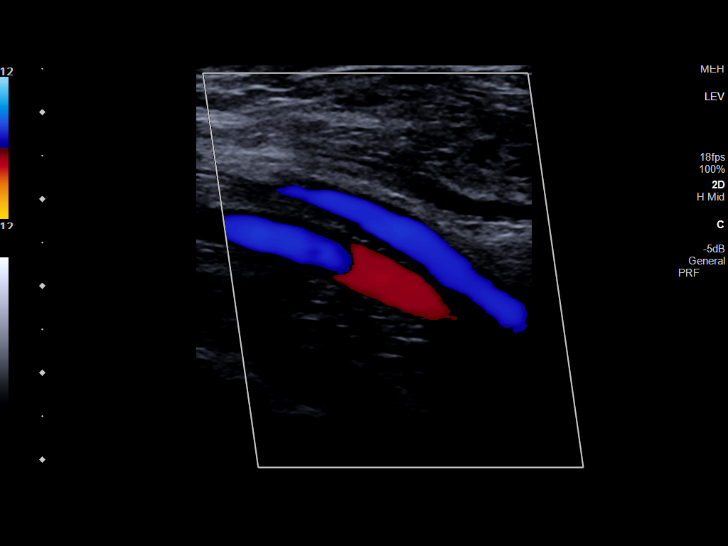
[im 31/34]
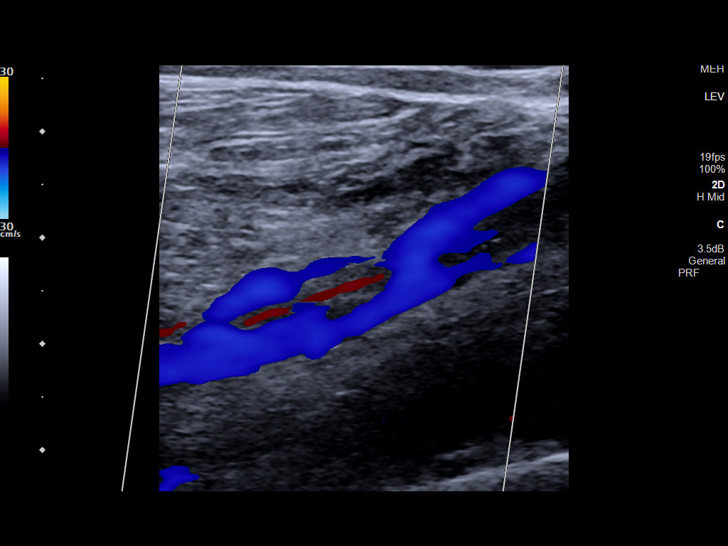
[im 34/34]
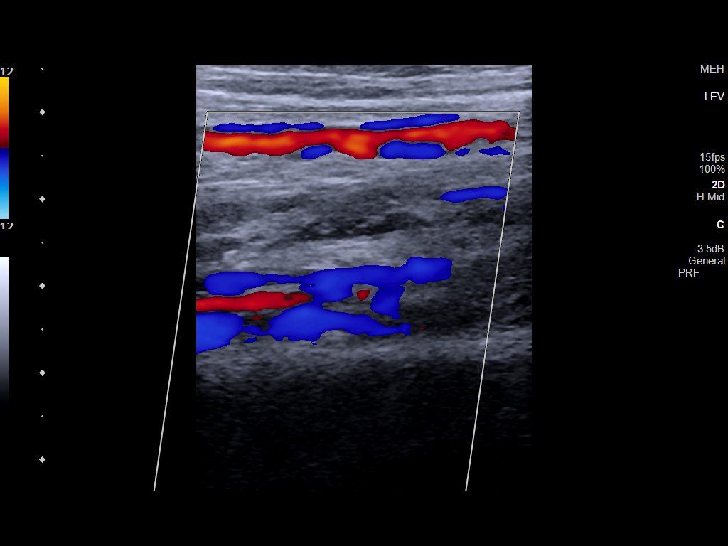

[13 of 24 positions shown; findings below may reference images not displayed]

FINDINGS: Contralateral Common Femoral Vein: Respiratory phasicity is normal
and symmetric with the symptomatic side. No evidence of thrombus.
Normal compressibility.

Common Femoral Vein: No evidence of thrombus. Normal
compressibility, respiratory phasicity and response to augmentation.

Saphenofemoral Junction: No evidence of thrombus. Normal
compressibility and flow on color Doppler imaging.

Profunda Femoral Vein: No evidence of thrombus. Normal
compressibility and flow on color Doppler imaging.

Femoral Vein: No evidence of thrombus. Normal compressibility,
respiratory phasicity and response to augmentation.

Popliteal Vein: Some residual nonocclusive mural thrombus remains
with subjectively improved flow by grayscale and with duplex
sampling.

Calf Veins: Resolution of peroneal vein thrombus. Visualized
posterior tibial and peroneal veins demonstrate normal patency.
Normal compressibility and flow on color Doppler imaging.

Superficial Great Saphenous Vein: No evidence of thrombus. Normal
compressibility.

Venous Reflux:  None.

Other Findings: No evidence of superficial thrombophlebitis or
abnormal fluid collection.
IMPRESSION: Improvement since the prior study on 02/22/2021 with improved flow
in the left popliteal vein with some residual nonocclusive chronic
mural thrombus present and complete resolution of left peroneal vein
thrombus.

## 2022-05-08 DIAGNOSIS — M79606 Pain in leg, unspecified: Secondary | ICD-10-CM | POA: Insufficient documentation

## 2022-05-08 NOTE — Progress Notes (Signed)
MRN : [id number]  Troy Perry is a 56 y.o. (1966/07/21) male who presents with chief complaint of legs hurt and swell.  History of Present Illness:   The patient presents to the office for evaluation of bilateral groin pain.  He has a history of DVT in the left lower extremity.  The initial symptoms were pain and swelling in the lower extremity.   He is a Fut[employer] trader and since his DVT has noted increasing discomfort in both groins especially with riding.  It's like "when your arm is falling asleep"    The patient has been using compression therapy at this point.  No SOB or pleuritic chest pains.  No cough or hemoptysis.  No blood per rectum or blood in any sputum.  No excessive bruising per the patient.   He is currently taking Eliquis 2.5 mg bid  No outpatient medications have been marked as taking for the 05/09/22 encounter (Appointment) with Delana Meyer, Dolores Lory, MD.    Past Medical History:  Diagnosis Date   Allergy    Basal cell carcinoma 12/19/2021   Right mid lateral nose. Nodular pattern. Mohs scheduled.   Chicken pox    GERD (gastroesophageal reflux disease)     Past Surgical History:  Procedure Laterality Date   ANTERIOR CRUCIATE LIGAMENT REPAIR Left    Basal cell carcinoma removal   03/29/2022   Ordered by Dr. Lacinda Axon   KNEE ARTHROSCOPY W/ MEDIAL COLLATERAL LIGAMENT (MCL) REPAIR Left    KNEE CARTILAGE SURGERY Bilateral    NECK SURGERY  08/26/2004   C6, C7   SHOULDER SURGERY Left    and arm    Social History Social History   Tobacco Use   Smoking status: Never   Smokeless tobacco: Never  Vaping Use   Vaping Use: Never used  Substance Use Topics   Alcohol use: Yes    Alcohol/week: 10.0 standard drinks of alcohol    Types: 8 Glasses of wine, 2 Standard drinks or equivalent per week    Comment: social--daily   Drug use: No    Family History Family History  Problem Relation Age of Onset   Hyperlipidemia Father    Hypertension Father     Diabetes Father    Arthritis Maternal Grandfather    Cancer Maternal Grandfather        Prostate   Diabetes Paternal Grandfather     No Known Allergies   REVIEW OF SYSTEMS (Negative unless checked)  Constitutional: '[]'$ Weight loss  '[]'$ Fever  '[]'$ Chills Cardiac: '[]'$ Chest pain   '[]'$ Chest pressure   '[]'$ Palpitations   '[]'$ Shortness of breath when laying flat   '[]'$ Shortness of breath with exertion. Vascular:  '[]'$ Pain in legs with walking   '[]'$ Pain in legs at rest  '[]'$ History of DVT   '[]'$ Phlebitis   '[x]'$ Swelling in legs   '[]'$ Varicose veins   '[]'$ Non-healing ulcers Pulmonary:   '[]'$ Uses home oxygen   '[]'$ Productive cough   '[]'$ Hemoptysis   '[]'$ Wheeze  '[]'$ COPD   '[]'$ Asthma Neurologic:  '[]'$ Dizziness   '[]'$ Seizures   '[]'$ History of stroke   '[]'$ History of TIA  '[]'$ Aphasia   '[]'$ Vissual changes   '[]'$ Weakness or numbness in arm   '[]'$ Weakness or numbness in leg Musculoskeletal:   '[]'$ Joint swelling   '[]'$ Joint pain   '[]'$ Low back pain Hematologic:  '[]'$ Easy bruising  '[]'$ Easy bleeding   '[]'$ Hypercoagulable state   '[]'$ Anemic Gastrointestinal:  '[]'$ Diarrhea   '[]'$ Vomiting  '[]'$ Gastroesophageal reflux/heartburn   '[]'$ Difficulty swallowing. Genitourinary:  '[]'$ Chronic kidney  disease   '[]'$ Difficult urination  '[]'$ Frequent urination   '[]'$ Blood in urine Skin:  '[]'$ Rashes   '[]'$ Ulcers  Psychological:  '[]'$ History of anxiety   '[]'$  History of major depression.  Physical Examination  There were no vitals filed for this visit. There is no height or weight on file to calculate BMI. Gen: WD/WN, NAD Head: [place]/AT, No temporalis wasting.  Ear/Nose/Throat: Hearing grossly intact, nares w/o erythema or drainage, pinna without lesions Eyes: PER, EOMI, sclera nonicteric.  Neck: Supple, no gross masses.  No JVD.  Pulmonary:  Good air movement, no audible wheezing, no use of accessory muscles.  Cardiac: RRR, precordium not hyperdynamic. Vascular:  trace soft pitting edema  Vessel Right Left  Radial Palpable Palpable  Gastrointestinal: soft, non-distended. No guarding/no peritoneal signs.   Musculoskeletal: M/S 5/5 throughout.  No deformity.  Neurologic: CN 2-12 intact. Pain and light touch intact in extremities.  Symmetrical.  Speech is fluent. Motor exam as listed above. Psychiatric: Judgment intact, Mood & affect appropriate for pt's clinical situation. Dermatologic: No venous rashes no ulcers noted.  No changes consistent with cellulitis. Lymph : No lichenification or skin changes of chronic lymphedema.  CBC Lab Results  Component Value Date   WBC 5.8 04/08/2022   HGB 14.1 04/08/2022   HCT 41.4 04/08/2022   MCV 93.9 04/08/2022   PLT 240 04/08/2022    BMET    Component Value Date/Time   NA 135 04/08/2022 1331   K 4.2 04/08/2022 1331   CL 97 (L) 04/08/2022 1331   CO2 27 04/08/2022 1331   GLUCOSE 138 (H) 04/08/2022 1331   BUN 18 04/08/2022 1331   CREATININE 0.93 04/08/2022 1331   CREATININE 0.83 12/03/2021 1516   CALCIUM 9.5 04/08/2022 1331   GFRNONAA >60 04/08/2022 1331   CrCl cannot be calculated (Patient's most recent lab result is older than the maximum 21 days allowed.).  COAG No results found for: "INR", "PROTIME"  Radiology No results found.   Assessment/Plan 1. Pain of left lower extremity The patient has c/o that are not clearly related to a distal left leg DVT.  Given his intensive cycling there are several vascular injuries that are consistent with his c/o.  CT angiogram of the abdomen and pelvis is indicated.  Risk and benefits were reviewed the patient.  Indications for the procedure were reviewed.  All questions were answered, the patient agrees to proceed.    - CT Angio Abd/Pel w/ and/or w/o; Future  2. History of DVT (deep vein thrombosis) See #1  Continue Eliquis 2.5 mg po bid.  - CT Angio Abd/Pel w/ and/or w/o; Future  3. Heterozygous for prothrombin G20210A mutation (HCC) Continue Eliquis 2.5 mg po bid.  Follow up with hematology    Hortencia Pilar, MD  05/08/2022 9:58 AM

## 2022-05-09 ENCOUNTER — Encounter (INDEPENDENT_AMBULATORY_CARE_PROVIDER_SITE_OTHER): Payer: Self-pay | Admitting: Vascular Surgery

## 2022-05-09 ENCOUNTER — Ambulatory Visit (INDEPENDENT_AMBULATORY_CARE_PROVIDER_SITE_OTHER): Payer: BC Managed Care – PPO | Admitting: Vascular Surgery

## 2022-05-09 VITALS — BP 126/80 | HR 62 | Resp 17 | Ht 70.0 in | Wt 220.0 lb

## 2022-05-09 DIAGNOSIS — M79605 Pain in left leg: Secondary | ICD-10-CM

## 2022-05-09 DIAGNOSIS — Z86718 Personal history of other venous thrombosis and embolism: Secondary | ICD-10-CM

## 2022-05-09 DIAGNOSIS — D6852 Prothrombin gene mutation: Secondary | ICD-10-CM | POA: Diagnosis not present

## 2022-05-21 ENCOUNTER — Encounter (INDEPENDENT_AMBULATORY_CARE_PROVIDER_SITE_OTHER): Payer: Self-pay | Admitting: Vascular Surgery

## 2022-05-22 ENCOUNTER — Telehealth (INDEPENDENT_AMBULATORY_CARE_PROVIDER_SITE_OTHER): Payer: Self-pay | Admitting: Vascular Surgery

## 2022-05-22 NOTE — Telephone Encounter (Signed)
Spoke with patient and advised that his prior authorization had been obtained. He stated that [name] had already called and given him the number to radiology. I advised for pt to call and scheduled CT and once he did, to call to our office to schedule a CT results appointment with Dr. Delana Meyer. Pt acknowledged. Nothing further is needed at this time.

## 2022-05-22 NOTE — Telephone Encounter (Signed)
Called pt and gave him Radiology number

## 2022-06-03 ENCOUNTER — Ambulatory Visit
Admission: RE | Admit: 2022-06-03 | Discharge: 2022-06-03 | Disposition: A | Discharge status: Home / Self Care | Payer: BC Managed Care – PPO | Source: Ambulatory Visit | Attending: Vascular Surgery | Admitting: Vascular Surgery

## 2022-06-03 DIAGNOSIS — K5730 Diverticulosis of large intestine without perforation or abscess without bleeding: Secondary | ICD-10-CM | POA: Diagnosis not present

## 2022-06-03 DIAGNOSIS — Z86718 Personal history of other venous thrombosis and embolism: Secondary | ICD-10-CM | POA: Insufficient documentation

## 2022-06-03 DIAGNOSIS — M79605 Pain in left leg: Secondary | ICD-10-CM | POA: Diagnosis not present

## 2022-06-03 MED ORDER — IOHEXOL 350 MG/ML SOLN
100.0000 mL | Freq: Once | INTRAVENOUS | Status: AC | PRN
  Administered 2022-06-03: 100 mL via INTRAVENOUS

## 2022-06-10 ENCOUNTER — Encounter: Payer: Self-pay | Admitting: Internal Medicine

## 2022-06-10 ENCOUNTER — Ambulatory Visit (INDEPENDENT_AMBULATORY_CARE_PROVIDER_SITE_OTHER): Payer: BC Managed Care – PPO | Admitting: Internal Medicine

## 2022-06-10 VITALS — BP 136/88 | HR 68 | Temp 97.3°F | Wt 225.0 lb

## 2022-06-10 DIAGNOSIS — E6609 Other obesity due to excess calories: Secondary | ICD-10-CM

## 2022-06-10 DIAGNOSIS — Z86718 Personal history of other venous thrombosis and embolism: Secondary | ICD-10-CM

## 2022-06-10 DIAGNOSIS — G4733 Obstructive sleep apnea (adult) (pediatric): Secondary | ICD-10-CM | POA: Diagnosis not present

## 2022-06-10 DIAGNOSIS — R7303 Prediabetes: Secondary | ICD-10-CM

## 2022-06-10 DIAGNOSIS — D6852 Prothrombin gene mutation: Secondary | ICD-10-CM

## 2022-06-10 DIAGNOSIS — M79605 Pain in left leg: Secondary | ICD-10-CM

## 2022-06-10 DIAGNOSIS — E785 Hyperlipidemia, unspecified: Secondary | ICD-10-CM | POA: Diagnosis not present

## 2022-06-10 DIAGNOSIS — B009 Herpesviral infection, unspecified: Secondary | ICD-10-CM

## 2022-06-10 DIAGNOSIS — Z6832 Body mass index (BMI) 32.0-32.9, adult: Secondary | ICD-10-CM

## 2022-06-10 NOTE — Assessment & Plan Note (Signed)
Consider MRI lumbar spine if symptoms persist

## 2022-06-10 NOTE — Assessment & Plan Note (Signed)
Encouraged low carb diet and exercise for weight loss

## 2022-06-10 NOTE — Assessment & Plan Note (Signed)
Continue Valcyclovir as needed

## 2022-06-10 NOTE — Assessment & Plan Note (Signed)
On lifelong Eliquis

## 2022-06-10 NOTE — Assessment & Plan Note (Signed)
Encourage diet and exercise for weight loss 

## 2022-06-10 NOTE — Patient Instructions (Signed)

## 2022-06-10 NOTE — Progress Notes (Signed)
Subjective:    Patient ID: Troy Perry, male    DOB: 01/07/1966, 56 y.o.   MRN: [id number]  HPI  Patient presents to clinic today for follow-up of chronic conditions.  OSA: He averages 7 hours of sleep per night without the use of the CPAP.  He is using a dental device.  Sleep study from 01/2014 reviewed.  Genital Herpes: He denies recent outbreak.  He takes Valacyclovir only as needed.  History of DVT with Heterozygous Prothrombin Mutation: He is on lifelong Eliquis.  He follows with hematology.  Prediabetes: His last A1c was 5.8%, 05/2021.  He is not taking any oral diabetic medication at this time.  He does not check his sugars.  Chronic Bilateral Leg Pain: He reports intermittent sharp, throbbing pain that occurs intermittently. He reports this started after he had a DVT LLE. He has seen hematology and vascular for the same.   Review of Systems   Past Medical History:  Diagnosis Date   Allergy    Basal cell carcinoma 12/19/2021   Right mid lateral nose. Nodular pattern. Mohs scheduled.   Chicken pox    GERD (gastroesophageal reflux disease)     Current Outpatient Medications  Medication Sig Dispense Refill   apixaban (ELIQUIS) 2.5 MG TABS tablet Take 1 tablet (2.5 mg total) by mouth 2 (two) times daily. 60 tablet 5   fluticasone (FLONASE) 50 MCG/ACT nasal spray Place into both nostrils daily.     Multiple Vitamins-Minerals (MULTIVITAMIN WITH MINERALS) tablet Take 1 tablet by mouth daily.     No current facility-administered medications for this visit.    No Known Allergies  Family History  Problem Relation Age of Onset   Hyperlipidemia Father    Hypertension Father    Diabetes Father    Arthritis Maternal Grandfather    Cancer Maternal Grandfather        Prostate   Diabetes Paternal Grandfather     Social History   Socioeconomic History   Marital status: Single    Spouse name: Not on file   Number of children: Not on file   Years of education: Not on  file   Highest education level: Not on file  Occupational History   Occupation: IT   Tobacco Use   Smoking status: Never   Smokeless tobacco: Never  Vaping Use   Vaping Use: Never used  Substance and Sexual Activity   Alcohol use: Yes    Alcohol/week: 10.0 standard drinks of alcohol    Types: 8 Glasses of wine, 2 Standard drinks or equivalent per week    Comment: social--daily   Drug use: No   Sexual activity: Yes  Other Topics Concern   Not on file  Social History Narrative   Not on file   Social Determinants of Health   Financial Resource Strain: Not on file  Food Insecurity: Not on file  Transportation Needs: Not on file  Physical Activity: Not on file  Stress: Not on file  Social Connections: Not on file  Intimate Partner Violence: Not on file     Constitutional: Denies fever, malaise, fatigue, headache or abrupt weight changes.  HEENT: Denies eye pain, eye redness, ear pain, ringing in the ears, wax buildup, runny nose, nasal congestion, bloody nose, or sore throat. Respiratory: Denies difficulty breathing, shortness of breath, cough or sputum production.   Cardiovascular: Denies chest pain, chest tightness, palpitations or swelling in the hands or feet.  Gastrointestinal: Denies abdominal pain, bloating, constipation, diarrhea or blood in  the stool.  GU: Denies urgency, frequency, pain with urination, burning sensation, blood in urine, odor or discharge. Musculoskeletal: Pt reports chronic bilateral leg pain. Denies decrease in range of motion, difficulty with gait, or joint pain and swelling.  Skin: Denies redness, rashes, lesions or ulcercations.  Neurological: Denies dizziness, difficulty with memory, difficulty with speech or problems with balance and coordination.  Psych: Denies anxiety, depression, SI/HI.  No other specific complaints in a complete review of systems (except as listed in HPI above).  Objective:   Physical Exam  BP 136/88 (BP Location: Right  Arm, Patient Position: Sitting, Cuff Size: Normal)   Pulse 68   Temp (!) 97.3 F (36.3 C) (Temporal)   Wt 225 lb (102.1 kg)   SpO2 99%   BMI 32.28 kg/m   Wt Readings from Last 3 Encounters:  05/09/22 220 lb (99.8 kg)  12/03/21 220 lb (99.8 kg)  09/11/21 220 lb 3.2 oz (99.9 kg)    General: Appears his stated age, obese, in NAD. Skin: Warm, dry and intact. HEENT: Head: normal shape and size; Eyes: sclera white, no icterus, conjunctiva pink, PERRLA and EOMs intact;  Cardiovascular: Normal rate and rhythm. S1,S2 noted.  No murmur, rubs or gallops noted. No JVD or BLE edema. No carotid bruits noted. Pulmonary/Chest: Normal effort and positive vesicular breath sounds. No respiratory distress. No wheezes, rales or ronchi noted.  Musculoskeletal: No difficulty with gait.  Neurological: Alert and oriented.   BMET    Component Value Date/Time   NA 135 04/08/2022 1331   K 4.2 04/08/2022 1331   CL 97 (L) 04/08/2022 1331   CO2 27 04/08/2022 1331   GLUCOSE 138 (H) 04/08/2022 1331   BUN 18 04/08/2022 1331   CREATININE 0.93 04/08/2022 1331   CREATININE 0.83 12/03/2021 1516   CALCIUM 9.5 04/08/2022 1331   GFRNONAA >60 04/08/2022 1331    Lipid Panel     Component Value Date/Time   HDL 49 04/25/2008 0000   LDLCALC 59 04/25/2008 0000    CBC    Component Value Date/Time   WBC 5.8 04/08/2022 1331   RBC 4.41 04/08/2022 1331   HGB 14.1 04/08/2022 1331   HCT 41.4 04/08/2022 1331   PLT 240 04/08/2022 1331   MCV 93.9 04/08/2022 1331   MCH 32.0 04/08/2022 1331   MCHC 34.1 04/08/2022 1331   RDW 12.5 04/08/2022 1331   LYMPHSABS 2.1 04/08/2022 1331   MONOABS 0.6 04/08/2022 1331   EOSABS 0.2 04/08/2022 1331   BASOSABS 0.1 04/08/2022 1331    Hgb A1C Lab Results  Component Value Date   HGBA1C 5.8 (A) 06/13/2021           Assessment & Plan:     RTC in 6 months for your annual exam [name], NP

## 2022-06-10 NOTE — Assessment & Plan Note (Signed)
He will continue Eliquis Continue to follow with hematology

## 2022-06-10 NOTE — Assessment & Plan Note (Signed)
Encouraged weight loss as this can help reduce sleep apnea Continue dental device

## 2022-06-11 LAB — CBC
HCT: 42 % (ref 38.5–50.0)
Hemoglobin: 14.8 g/dL (ref 13.2–17.1)
MCH: 32.5 pg (ref 27.0–33.0)
MCHC: 35.2 g/dL (ref 32.0–36.0)
MCV: 92.3 fL (ref 80.0–100.0)
MPV: 9.6 fL (ref 7.5–12.5)
Platelets: 264 10*3/uL (ref 140–400)
RBC: 4.55 10*6/uL (ref 4.20–5.80)
RDW: 13.2 % (ref 11.0–15.0)
WBC: 6.2 10*3/uL (ref 3.8–10.8)

## 2022-06-11 LAB — LIPID PANEL
Cholesterol: 192 mg/dL (ref <200)
HDL: 66 mg/dL (ref >=40)
LDL Cholesterol (Calc): 80 mg/dL (calc)
Non-HDL Cholesterol (Calc): 126 mg/dL (calc) (ref <130)
Total CHOL/HDL Ratio: 2.9 (calc) (ref <5.0)
Triglycerides: 383 mg/dL — ABNORMAL HIGH (ref <150)

## 2022-06-11 LAB — HEMOGLOBIN A1C
Hgb A1c MFr Bld: 6.2 % of total Hgb — ABNORMAL HIGH (ref <5.7)
Mean Plasma Glucose: 131 mg/dL
eAG (mmol/L): 7.3 mmol/L

## 2022-06-11 LAB — COMPLETE METABOLIC PANEL WITH GFR
AG Ratio: 1.7 (calc) (ref 1.0–2.5)
ALT: 33 U/L (ref 9–46)
AST: 24 U/L (ref 10–35)
Albumin: 4.7 g/dL (ref 3.6–5.1)
Alkaline phosphatase (APISO): 55 U/L (ref 35–144)
BUN: 21 mg/dL (ref 7–25)
CO2: 30 mmol/L (ref 20–32)
Calcium: 10.1 mg/dL (ref 8.6–10.3)
Chloride: 99 mmol/L (ref 98–110)
Creat: 1.07 mg/dL (ref 0.70–1.30)
Globulin: 2.8 g/dL (calc) (ref 1.9–3.7)
Glucose, Bld: 90 mg/dL (ref 65–99)
Potassium: 4 mmol/L (ref 3.5–5.3)
Sodium: 140 mmol/L (ref 135–146)
Total Bilirubin: 0.4 mg/dL (ref 0.2–1.2)
Total Protein: 7.5 g/dL (ref 6.1–8.1)
eGFR: 81 mL/min/{1.73_m2} (ref >=60)

## 2022-06-12 NOTE — Progress Notes (Signed)
MRN : [id number]  Troy Perry is a 56 y.o. (Feb 21, 1966) male who presents with chief complaint of legs hurt and swell.  History of Present Illness:  The patient presents to the office for evaluation of DVT.  DVT was identified at [facility] by Duplex ultrasound.  The initial symptoms were pain and swelling in the lower extremity.  The patient notes the affected leg continues to be painful with dependency and swells with dependency.  Symptoms are much better with elevation.  The patient notes minimal edema in the morning which steadily worsens throughout the day.    The patient has not been using compression therapy at this point.  The patient is on anticoagulation.  No SOB or pleuritic chest pains.  No cough or hemoptysis.  No blood per rectum or blood in any sputum.  No excessive bruising per the patient.   No recent shortening of the patient's walking distance or new symptoms consistent with claudication.  No history of rest pain symptoms. No new ulcers or wounds of the lower extremities have occurred.  The patient denies amaurosis fugax or recent TIA symptoms. There are no recent neurological changes noted. No recent episodes of angina or shortness of breath documented.   CT scan dated 06/03/2022 shows the IVC and pelvic venous systems are widely patent.   There is no significant mass effect upon the left common iliac vein to suggest May-Thurner syndrome.  No outpatient medications have been marked as taking for the 06/17/22 encounter (Appointment) with Delana Meyer, Dolores Lory, MD.    Past Medical History:  Diagnosis Date   Allergy    Basal cell carcinoma 12/19/2021   Right mid lateral nose. Nodular pattern. Mohs scheduled.   Chicken pox    GERD (gastroesophageal reflux disease)     Past Surgical History:  Procedure Laterality Date   ANTERIOR CRUCIATE LIGAMENT REPAIR Left    Basal cell carcinoma removal   03/29/2022   Ordered by Dr. Lacinda Axon   KNEE ARTHROSCOPY W/ MEDIAL  COLLATERAL LIGAMENT (MCL) REPAIR Left    KNEE CARTILAGE SURGERY Bilateral    NECK SURGERY  08/26/2004   C6, C7   SHOULDER SURGERY Left    and arm    Social History Social History   Tobacco Use   Smoking status: Never   Smokeless tobacco: Never  Vaping Use   Vaping Use: Never used  Substance Use Topics   Alcohol use: Yes    Alcohol/week: 10.0 standard drinks of alcohol    Types: 8 Glasses of wine, 2 Standard drinks or equivalent per week    Comment: social--daily   Drug use: No    Family History Family History  Problem Relation Age of Onset   Hyperlipidemia Father    Hypertension Father    Diabetes Father    Arthritis Maternal Grandfather    Cancer Maternal Grandfather        Prostate   Diabetes Paternal Grandfather     No Known Allergies   REVIEW OF SYSTEMS (Negative unless checked)  Constitutional: '[]'$ Weight loss  '[]'$ Fever  '[]'$ Chills Cardiac: '[]'$ Chest pain   '[]'$ Chest pressure   '[]'$ Palpitations   '[]'$ Shortness of breath when laying flat   '[]'$ Shortness of breath with exertion. Vascular:  '[]'$ Pain in legs with walking   '[x]'$ Pain in legs at rest  '[]'$ History of DVT   '[]'$ Phlebitis   '[x]'$ Swelling in legs   '[]'$ Varicose veins   '[]'$ Non-healing ulcers Pulmonary:   '[]'$ Uses home oxygen   '[]'$   Productive cough   '[]'$ Hemoptysis   '[]'$ Wheeze  '[]'$ COPD   '[]'$ Asthma Neurologic:  '[]'$ Dizziness   '[]'$ Seizures   '[]'$ History of stroke   '[]'$ History of TIA  '[]'$ Aphasia   '[]'$ Vissual changes   '[]'$ Weakness or numbness in arm   '[]'$ Weakness or numbness in leg Musculoskeletal:   '[]'$ Joint swelling   '[]'$ Joint pain   '[]'$ Low back pain Hematologic:  '[]'$ Easy bruising  '[]'$ Easy bleeding   '[]'$ Hypercoagulable state   '[]'$ Anemic Gastrointestinal:  '[]'$ Diarrhea   '[]'$ Vomiting  '[]'$ Gastroesophageal reflux/heartburn   '[]'$ Difficulty swallowing. Genitourinary:  '[]'$ Chronic kidney disease   '[]'$ Difficult urination  '[]'$ Frequent urination   '[]'$ Blood in urine Skin:  '[]'$ Rashes   '[]'$ Ulcers  Psychological:  '[]'$ History of anxiety   '[]'$  History of major depression.  Physical  Examination  There were no vitals filed for this visit. There is no height or weight on file to calculate BMI. Gen: WD/WN, NAD Head: [place]/AT, No temporalis wasting.  Ear/Nose/Throat: Hearing grossly intact, nares w/o erythema or drainage, pinna without lesions Eyes: PER, EOMI, sclera nonicteric.  Neck: Supple, no gross masses.  No JVD.  Pulmonary:  Good air movement, no audible wheezing, no use of accessory muscles.  Cardiac: RRR, precordium not hyperdynamic. Vascular:  scattered varicosities present bilaterally.  Mild venous stasis changes to the legs bilaterally.  No significant soft pitting edema  Vessel Right Left  Radial Palpable Palpable  Gastrointestinal: soft, non-distended. No guarding/no peritoneal signs.  Musculoskeletal: M/S 5/5 throughout.  No deformity.  Neurologic: CN 2-12 intact. Pain and light touch intact in extremities.  Symmetrical.  Speech is fluent. Motor exam as listed above. Psychiatric: Judgment intact, Mood & affect appropriate for pt's clinical situation. Dermatologic: Venous rashes no ulcers noted.  No changes consistent with cellulitis. Lymph : No lichenification or skin changes of chronic lymphedema.  CBC Lab Results  Component Value Date   WBC 6.2 06/10/2022   HGB 14.8 06/10/2022   HCT 42.0 06/10/2022   MCV 92.3 06/10/2022   PLT 264 06/10/2022    BMET    Component Value Date/Time   NA 140 06/10/2022 1600   K 4.0 06/10/2022 1600   CL 99 06/10/2022 1600   CO2 30 06/10/2022 1600   GLUCOSE 90 06/10/2022 1600   BUN 21 06/10/2022 1600   CREATININE 1.07 06/10/2022 1600   CALCIUM 10.1 06/10/2022 1600   GFRNONAA >60 04/08/2022 1331   Estimated Creatinine Clearance: 91.1 mL/min (by C-G formula based on SCr of 1.07 mg/dL).  COAG No results found for: "INR", "PROTIME"  Radiology CT Angio Abd/Pel w/ and/or w/o  Result Date: 06/03/2022 CLINICAL DATA:  History of May-Thurner syndrome, now with bilateral groin pain. EXAM: CTA ABDOMEN AND PELVIS WITHOUT  AND WITH CONTRAST TECHNIQUE: Multidetector CT imaging of the abdomen and pelvis was performed using the standard protocol during bolus administration of intravenous contrast. Multiplanar reconstructed images and MIPs were obtained and reviewed to evaluate the vascular anatomy. RADIATION DOSE REDUCTION: This exam was performed according to the departmental dose-optimization program which includes automated exposure control, adjustment of the mA and/or kV according to patient size and/or use of iterative reconstruction technique. CONTRAST:  113m OMNIPAQUE IOHEXOL 350 MG/ML SOLN COMPARISON:  Left lower extremity venous Doppler ultrasound-09/06/2021 (negative); 06/05/2021 (chronic nonocclusive DVT involving the left popliteal vein) Chest CT-06/11/2015 FINDINGS: VASCULAR Aorta: Normal caliber of the abdominal aorta. There is no significant atherosclerotic plaque within the abdominal aorta. No abdominal aortic dissection or perivascular stranding. Celiac: Widely patent without a hemodynamically significant narrowing. The right hepatic artery is incidentally noted to  arise from the main trunk of the celiac as opposed to the common hepatic artery. SMA: Widely patent without a hemodynamically significant narrowing. Conventional branching pattern. The distal tributaries of the SMA appear widely patent without discrete lumen filling defect to suggest distal embolism. Renals: Solitary bilaterally; widely patent without a hemodynamically significant narrowing. IMA: Widely patent without hemodynamically significant narrowing. Inflow: The bilateral common and external iliac arteries are tortuous though of normal caliber and widely patent without hemodynamically significant narrowing. There is a minimal amount of mixed calcified and noncalcified atherosclerotic plaque involving the left internal iliac artery, not resulting in hemodynamically significant narrowing. The bilateral internal iliac arteries are of normal caliber and  widely patent without hemodynamically significant narrowing. Proximal Outflow: The bilateral common and imaged portions of the bilateral deep and superficial femoral arteries are of normal caliber and widely patent without hemodynamically significant narrowing. Veins: The IVC and pelvic venous systems are widely patent. There is no significant mass effect upon the left common iliac vein to suggest May-Thurner syndrome. Review of the MIP images confirms the above findings. _________________________________________________________ NON-VASCULAR Lower chest: Limited visualization of the lower thorax demonstrates punctate (sub 6 mm subpleural nodules within the imaged left lower lobe (images 41 and 45, series 11), and approximately 8 mm right lower lobe pulmonary nodule (image 26, series 11) as well as a 6 mm right middle lobe pulmonary nodule (image 1, series 11), all of which are unchanged compared to remote chest CT performed 05/2015 and thus a benign etiology. Minimal dependent subpleural ground-glass atelectasis. Stable rounded atelectasis involving the anterior aspect of the lingula (image 13, series 11), subjacent to chronic left anterior rib fracture. No discrete focal airspace opacities. No pleural effusion. Borderline cardiomegaly.  No pericardial effusion. Hepatobiliary: There is diffuse decreased attenuation of the hepatic parenchyma on this postcontrast examination suggestive of hepatic steatosis. No discrete hepatic lesions. Normal appearance of the gallbladder given degree distention. No intra or extrahepatic biliary ductal dilatation. No ascites. Pancreas: Normal appearance of the pancreas. Spleen: Normal appearance of the spleen. There is a punctate splenule about the anterior aspect of the spleen. Adrenals/Urinary Tract: There is symmetric enhancement and excretion of the bilateral kidneys. No evidence of nephrolithiasis on this postcontrast examination. No discrete renal lesions. There is a minimal  amount of symmetric likely age and body habitus related perinephric stranding. No urinary obstruction. Normal appearance of the bilateral adrenal glands. There is mild thickening the urinary bladder wall, potentially accentuated due to underdistention. Stomach/Bowel: Scattered colonic diverticulosis without evidence of superimposed acute diverticulitis. Unremarkable colonic stool burden. Normal appearance of the terminal ileum and retrocecal appendix. No discrete areas of bowel wall thickening. No significant hiatal hernia. No pneumoperitoneum, pneumatosis or portal venous gas. Lymphatic: No bulky retroperitoneal, mesenteric, pelvic or inguinal lymphadenopathy. Reproductive: Normal appearance of the prostate gland. Dystrophic calcification scratched at seminal vesicle calcifications are seen bilaterally. Punctate phleboliths are seen within the left hemipelvis. Other: Minimal amount of subcutaneous edema about the midline of the low back. Musculoskeletal: No acute or aggressive osseous abnormalities. Stigmata of dish within the lower thoracic spine. Moderate DDD of L5-S1 with disc space height loss, endplate irregularity and sclerosis. Mild degenerative change the bilateral hips with joint space loss, subchondral sclerosis and osteophytosis. IMPRESSION: VASCULAR 1. Normal caliber of the abdominal aorta. 2. No CT evidence of May-Thurner syndrome. NON-VASCULAR 1. Suspected hepatic steatosis.  Correlation with LFTs is advised. 2. Scattered colonic diverticulosis without evidence superimposed acute diverticulitis. Electronically Signed   By: Eldridge Abrahams.D.  On: 06/03/2022 11:07     Assessment/Plan 1. History of DVT (deep vein thrombosis) Review of the CT scan with the patient shows essentially a normal scan.  Given his symptoms I believe this is a nerve compression issue which we discussed.  He will follow up PRN  2. Pain of left lower extremity See #1    Hortencia Pilar, MD  06/12/2022 2:43  PM

## 2022-06-17 ENCOUNTER — Encounter (INDEPENDENT_AMBULATORY_CARE_PROVIDER_SITE_OTHER): Payer: Self-pay | Admitting: Vascular Surgery

## 2022-06-17 ENCOUNTER — Ambulatory Visit (INDEPENDENT_AMBULATORY_CARE_PROVIDER_SITE_OTHER): Payer: BC Managed Care – PPO | Admitting: Vascular Surgery

## 2022-06-17 VITALS — BP 148/82 | HR 71 | Resp 17 | Ht 70.0 in | Wt 225.0 lb

## 2022-06-17 DIAGNOSIS — M79605 Pain in left leg: Secondary | ICD-10-CM

## 2022-06-17 DIAGNOSIS — Z86718 Personal history of other venous thrombosis and embolism: Secondary | ICD-10-CM

## 2022-06-20 ENCOUNTER — Encounter: Payer: Self-pay | Admitting: Internal Medicine

## 2022-06-20 DIAGNOSIS — R7303 Prediabetes: Secondary | ICD-10-CM

## 2022-06-20 DIAGNOSIS — E781 Pure hyperglyceridemia: Secondary | ICD-10-CM

## 2022-06-21 ENCOUNTER — Encounter: Payer: Self-pay | Admitting: Dietician

## 2022-06-21 ENCOUNTER — Encounter: Payer: BC Managed Care – PPO | Attending: Internal Medicine | Admitting: Dietician

## 2022-06-21 VITALS — Ht 70.0 in | Wt 221.8 lb

## 2022-06-21 DIAGNOSIS — E781 Pure hyperglyceridemia: Secondary | ICD-10-CM

## 2022-06-21 DIAGNOSIS — R7303 Prediabetes: Secondary | ICD-10-CM | POA: Diagnosis not present

## 2022-06-21 NOTE — Progress Notes (Signed)
Medical Nutrition Therapy: Visit start time: 0940  end time: 1040  Assessment:   Referral Diagnosis: prediabetes, hypertriglyceridemia Other medical history/ diagnoses: none significant Psychosocial issues/ stress concerns: none  Medications, supplements: reconciled list in medical record   Preferred learning method:  Auditory Visual Hands-on   Current weight: 221.8lbs Height: 5'10"  BMI: 31.82 Patient's personal weight goal: 195lbs  Progress and evaluation:  Patient reports  generally trying to follow mediterranean eating pattern, frequent exercise for some time, and does not know why labs are out of goal range; also some frustration as weight is not decreasing. He has reduced alcohol intake, further increased healthy food choices since labs were done about 2 weeks ago.  Lab results from 06/10/22: HbA1C 6.2%, increased from 5.8 in 2022; triglycerides 383, total cholesterol 192 Food allergies: reports intolerance to cooked cabbage Special diet practices: none  Patient seeks help with improving health risk and some weight loss    Dietary Intake:  Usual eating pattern includes 2-3 meals and 1-2 snacks per day. Dining out frequency: 4-6 meals per week. Who plans meals/ buys groceries? self Who prepares meals? self  Breakfast: skips Snack: none Brunch: lowfat cottage cheese and fruit peaches/ pineapple/ mandarin oranges + multigrain toast with prosciutto or avocado Snack: mixed nuts peanuts, almonds with dried fruit occ a few choc chips; energry bars, chocolate milk after bicyle race  Supper: whole wheat pasta or quinoa or couscous (prior to races), then lower carb after races ie rotisserie chicken with veg and pasta; zucchini ttos in air fryer; dessert once a week on Fridays Snack: occ ice cream once a week, no candy/ cookies Beverages: sugar free flavored water 100+oz crystal light  Physical activity: intense cycling 60 minutes 2x a week (600kcal ), lower tempo, longer duration  2-3x a week + yoga and stretching, walking dog, using standing desk for working, household chores and projects   Intervention:   Nutrition Care Education:   Basic nutrition: appropriate nutrient balance; appropriate meal and snack schedule; general nutrition guidelines    Weight control: determining reasonable weight loss rate; importance of low sugar and low fat choices; appropriate portions; estimated energy needs for weight loss at 2400-3000 kcal, provided guidance for 50% CHO, 25% pro, 25% fat prediabetes: appropriate meal and snack schedule; appropriate carb intake and balance, healthy carb choices; role of fiber, protein, fat; effects of stress, both mental and physical Hypertriglyceridemia:  target goals for lipids; healthy and unhealthy fats; importance of limiting processed starches and sugars and choosing whole grain/ high fiber carb foods; limiting alcohol intake   Other intervention notes: Patient reports increased/ frequent hunger when trying to consume less than 3000kcal daily. He seems to be making healthy food choices that should reduce health risk He will try reducing hours fasting and monitor intake more closely for 1 week or longer No follow up scheduled at this time; will plan to touch base with patient in 6-8 weeks to check on progress.    Nutritional Diagnosis:  [place]-2.2 Altered nutrition-related laboratory As related to prediabetes, hypertriglyceridemia.  As evidenced by elevated HbA1C and elevated triglycerides.   Education Materials given:  Visit summary with goals/ instructions   Learner/ who was taught:  Patient    Level of understanding: Verbalizes/ demonstrates competency  Demonstrated degree of understanding via:   Teach back Learning barriers: None  Willingness to learn/ readiness for change: Eager, change in progress   Monitoring and Evaluation:  Dietary intake, exercise, BG, blood lipids, and body weight  follow up: prn

## 2022-06-21 NOTE — Patient Instructions (Signed)
Try decreasing fasting time, no longer than 12 hours overnight. Plan to eat a meal or snack every 3-5 hours during the day. Log food and beverage intake for 7 or more days to get a baseline of calorie and macronutrient intake.  For 3000 calories, 50% carb would be 375g daily, 25% protein would be 125g (from protein foods), and remaining 25% from fat or 83g daily.

## 2022-06-24 ENCOUNTER — Encounter (INDEPENDENT_AMBULATORY_CARE_PROVIDER_SITE_OTHER): Payer: Self-pay

## 2022-07-28 IMAGING — US US EXTREM LOW VENOUS*L*
1 series · 14 of 24 positions shown · non-contrast
Comparison: CT chest, 12/07/2020. Lower extremity venous duplex,
06/05/2021, 02/22/2021 and 11/28/2018.

CLINICAL DATA: Follow-up DVT.

EXAM:
LEFT LOWER EXTREMITY VENOUS DOPPLER ULTRASOUND
TECHNIQUE: Gray-scale sonography with compression, as well as color and duplex
ultrasound, were performed to evaluate the deep venous system(s)
from the level of the common femoral vein through the popliteal and
proximal calf veins.

[Series 1: us venous img lower uni left (dvt) · portal-venous · 14 of 33 slices shown]
[im 1/33]
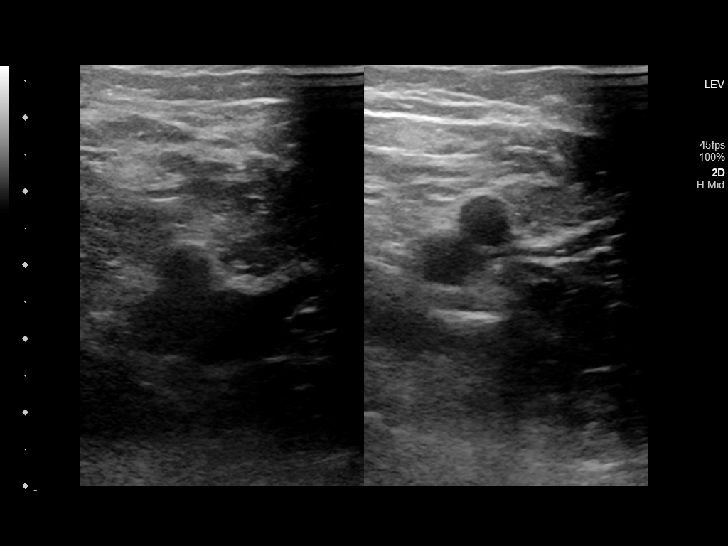
[im 3/33]
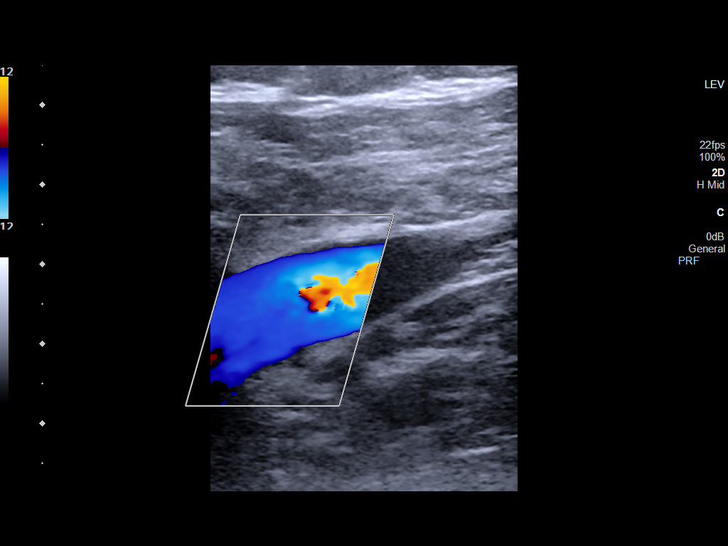
[im 6/33]
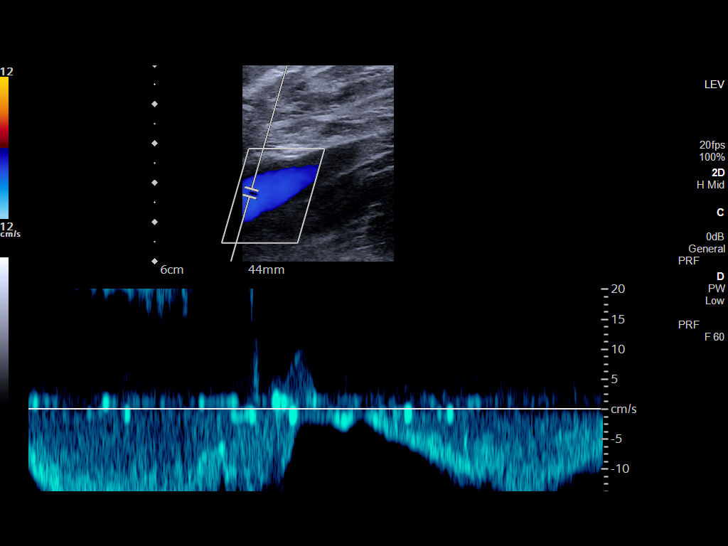
[im 9/33]
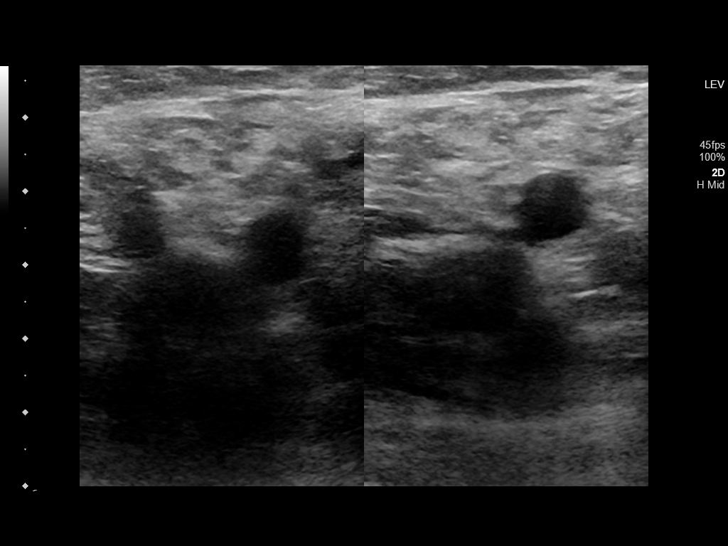
[im 10/33]
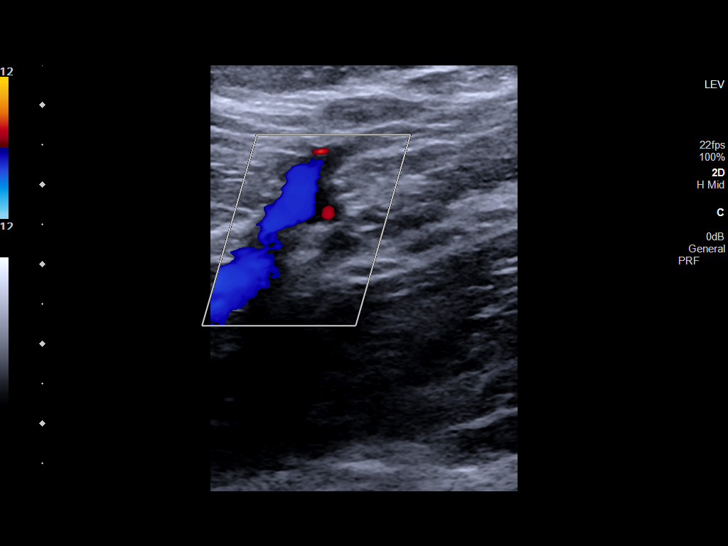
[im 13/33]
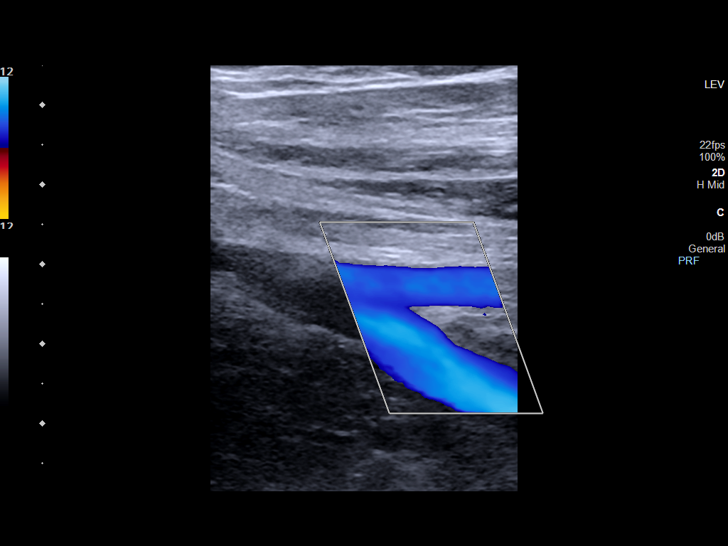
[im 16/33]
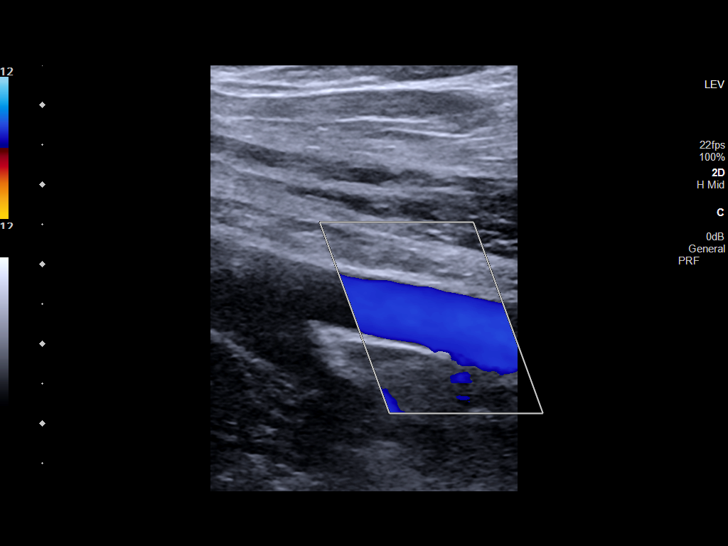
[im 17/33]
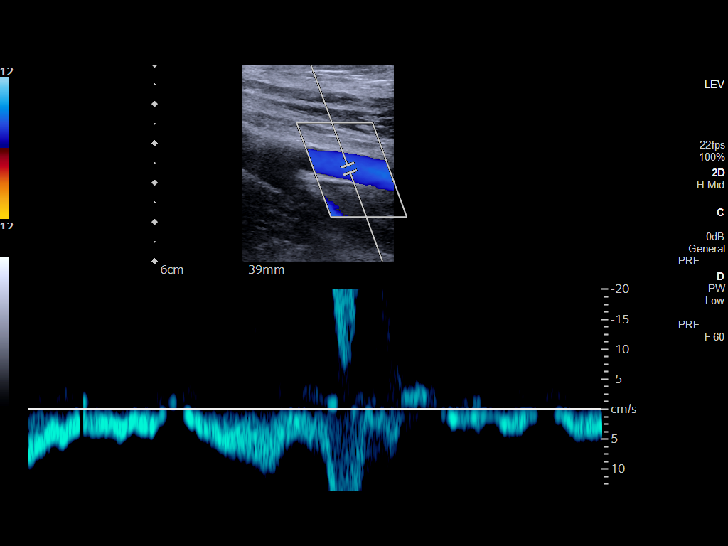
[im 20/33]
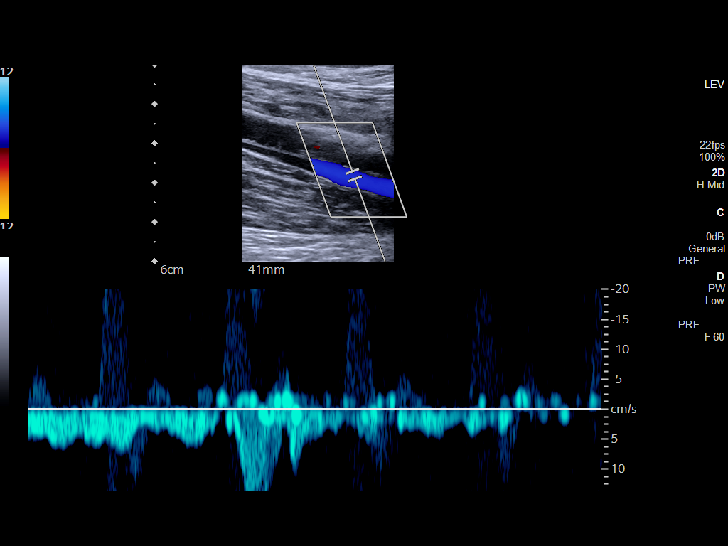
[im 23/33]
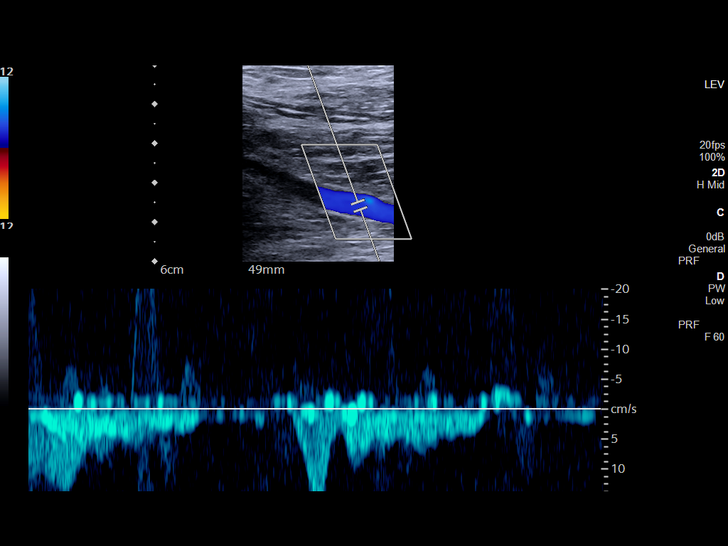
[im 26/33]
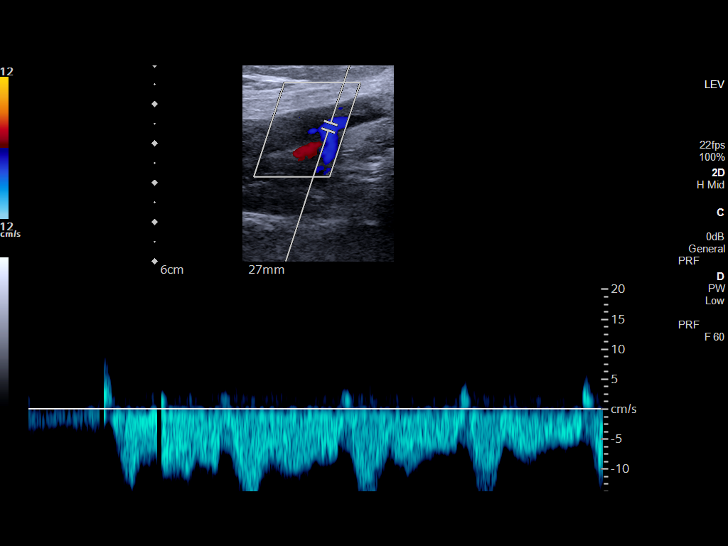
[im 27/33]
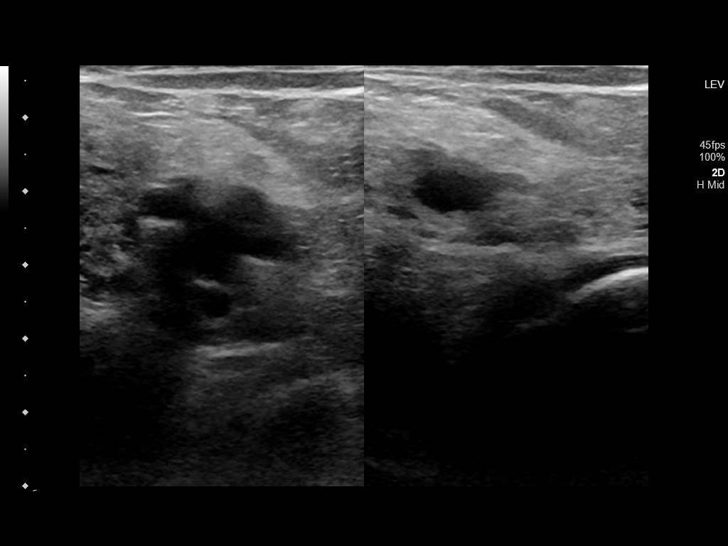
[im 30/33]
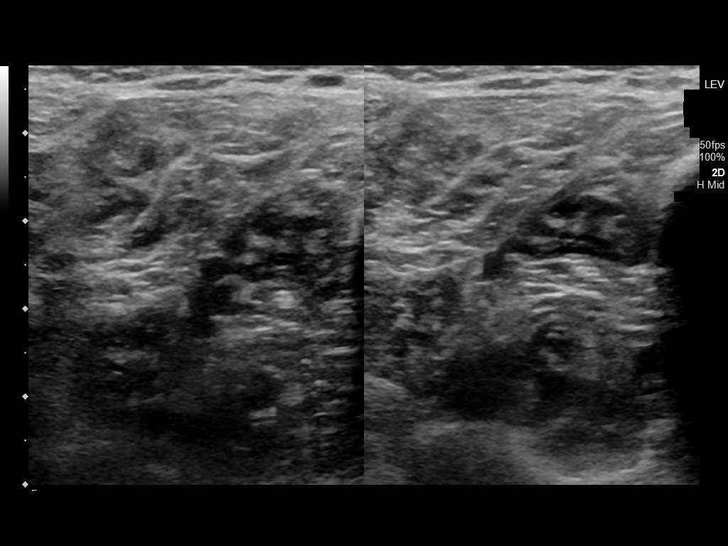
[im 33/33]
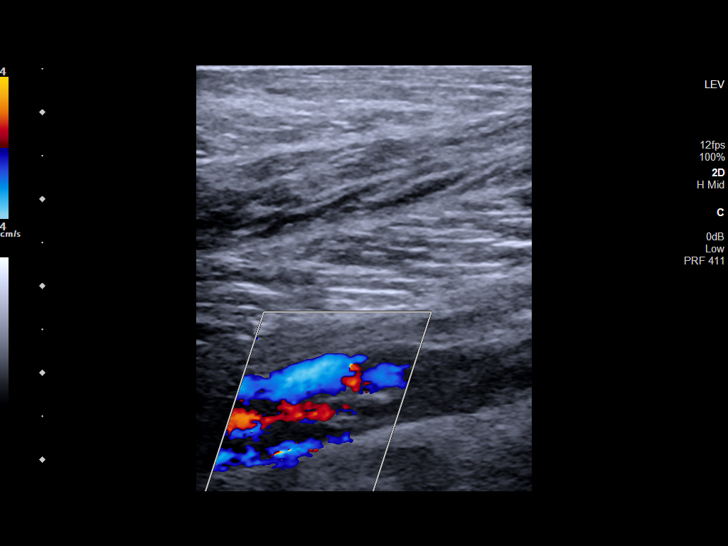

[14 of 24 positions shown; findings below may reference images not displayed]

FINDINGS: VENOUS

Normal compressibility of the LEFT common femoral and superficial
femoral, as well as the visualized calf veins. Visualized portions
of profunda femoral vein and great saphenous vein unremarkable.

Persistent homogeneously hypoechoic, peripheral partially-filling
defect within the popliteal vein, with incomplete coaptation on
compression. See key image.

Limited views of the contralateral common femoral vein are
unremarkable.

OTHER

No evidence of superficial thrombophlebitis or abnormal fluid
collection.

Limitations: none
IMPRESSION: Persistent, nonocclusive chronic-appearing DVT within the LEFT
popliteal vein.

No sonographic evidence of progression or propagation.

## 2022-10-07 DIAGNOSIS — C44311 Basal cell carcinoma of skin of nose: Secondary | ICD-10-CM | POA: Diagnosis not present

## 2022-10-09 ENCOUNTER — Inpatient Hospital Stay (HOSPITAL_BASED_OUTPATIENT_CLINIC_OR_DEPARTMENT_OTHER): Payer: BC Managed Care – PPO | Admitting: Oncology

## 2022-10-09 ENCOUNTER — Inpatient Hospital Stay: Payer: BC Managed Care – PPO | Attending: Oncology

## 2022-10-09 ENCOUNTER — Encounter: Payer: Self-pay | Admitting: Oncology

## 2022-10-09 VITALS — BP 145/87 | HR 67 | Temp 96.4°F | Resp 18 | Wt 221.3 lb

## 2022-10-09 DIAGNOSIS — Z8042 Family history of malignant neoplasm of prostate: Secondary | ICD-10-CM | POA: Insufficient documentation

## 2022-10-09 DIAGNOSIS — Z7901 Long term (current) use of anticoagulants: Secondary | ICD-10-CM

## 2022-10-09 DIAGNOSIS — Z86718 Personal history of other venous thrombosis and embolism: Secondary | ICD-10-CM

## 2022-10-09 DIAGNOSIS — I82502 Chronic embolism and thrombosis of unspecified deep veins of left lower extremity: Secondary | ICD-10-CM | POA: Diagnosis not present

## 2022-10-09 DIAGNOSIS — I87002 Postthrombotic syndrome without complications of left lower extremity: Secondary | ICD-10-CM

## 2022-10-09 DIAGNOSIS — D6852 Prothrombin gene mutation: Secondary | ICD-10-CM | POA: Diagnosis not present

## 2022-10-09 DIAGNOSIS — I82512 Chronic embolism and thrombosis of left femoral vein: Secondary | ICD-10-CM

## 2022-10-09 LAB — COMPREHENSIVE METABOLIC PANEL
ALT: 33 U/L (ref 0–44)
AST: 32 U/L (ref 15–41)
Albumin: 4.2 g/dL (ref 3.5–5.0)
Alkaline Phosphatase: 51 U/L (ref 38–126)
Anion gap: 10 (ref 5–15)
BUN: 18 mg/dL (ref 6–20)
CO2: 25 mmol/L (ref 22–32)
Calcium: 9.4 mg/dL (ref 8.9–10.3)
Chloride: 102 mmol/L (ref 98–111)
Creatinine, Ser: 0.98 mg/dL (ref 0.61–1.24)
GFR, Estimated: >60 mL/min (ref >60)
Glucose, Bld: 146 mg/dL — ABNORMAL HIGH (ref 70–99)
Potassium: 3.7 mmol/L (ref 3.5–5.1)
Sodium: 137 mmol/L (ref 135–145)
Total Bilirubin: 0.2 mg/dL — ABNORMAL LOW (ref 0.3–1.2)
Total Protein: 7.6 g/dL (ref 6.5–8.1)

## 2022-10-09 LAB — CBC WITH DIFFERENTIAL/PLATELET
Abs Immature Granulocytes: 0.01 10*3/uL (ref 0.00–0.07)
Basophils Absolute: 0 10*3/uL (ref 0.0–0.1)
Basophils Relative: 1 %
Eosinophils Absolute: 0.1 10*3/uL (ref 0.0–0.5)
Eosinophils Relative: 3 %
HCT: 40.5 % (ref 39.0–52.0)
Hemoglobin: 14.1 g/dL (ref 13.0–17.0)
Immature Granulocytes: 0 %
Lymphocytes Relative: 39 %
Lymphs Abs: 1.9 10*3/uL (ref 0.7–4.0)
MCH: 31.6 pg (ref 26.0–34.0)
MCHC: 34.8 g/dL (ref 30.0–36.0)
MCV: 90.8 fL (ref 80.0–100.0)
Monocytes Absolute: 0.3 10*3/uL (ref 0.1–1.0)
Monocytes Relative: 7 %
Neutro Abs: 2.4 10*3/uL (ref 1.7–7.7)
Neutrophils Relative %: 50 %
Platelets: 235 10*3/uL (ref 150–400)
RBC: 4.46 MIL/uL (ref 4.22–5.81)
RDW: 12.2 % (ref 11.5–15.5)
WBC: 4.9 10*3/uL (ref 4.0–10.5)
nRBC: 0 % (ref 0.0–0.2)

## 2022-10-09 LAB — ANTITHROMBIN III: AntiThromb III Func: 102 % (ref 75–120)

## 2022-10-09 MED ORDER — APIXABAN 2.5 MG PO TABS: 2.5000 mg | ORAL_TABLET | Freq: Two times a day (BID) | ORAL | 5 refills | Status: DC

## 2022-10-09 NOTE — Progress Notes (Signed)
Hematology/Oncology Progress note Telephone:(336) [phone] Fax:(336) [phone]    CHIEF COMPLAINTS/REASON FOR VISIT:  Chronic DVT  ASSESSMENT & PLAN:   Chronic deep vein thrombosis (DVT) (HCC) #Chronic left lower extremity DVT 09/06/2020, left lower extremity ultrasound showed persistent nonocclusive chronic appearing DVT within the left popliteal vein, 06/03/2022, CT angiogram showed no Mariel Aloe syndrome. Currently on Eliquis 2.5 mg twice daily prophylactically.   Check antiphospholipid syndrome, Antithrombin III, protein C&S levels.  Heterozygous for prothrombin G20210A mutation (HCC) Continue prophylactic anticoagulation with Eliquis 2.5 mg twice daily.  Orders Placed This Encounter  Procedures   CMP ([facility] only)    Standing Status:   Future    Standing Expiration Date:   10/10/2023   CBC with Differential ([facility] Only)    Standing Status:   Future    Standing Expiration Date:   04/09/2023   Follow-up in 6 months. All questions were answered. The patient knows to call the clinic with any problems, questions or concerns.  Earlie Server, MD, PhD [facility] Hematology Oncology 10/09/2022   HISTORY OF PRESENTING ILLNESS:   Troy Perry is a  57 y.o.  male with PMH listed below was seen in consultation at the request of  Troy Fenton, NP  for evaluation of acute DVT  Patient reports being very active.  2 weeks ago, he has experienced some shortness of breath with exertion as well as left side chest wall discomfort/pain.  A few days later, he reports tightness of the left lower extremity, some calf discomfort.  Patient was seen by primary care provider on 11/27/2020 and left lower extremity venous ultrasound was obtained.  Study showed Acute appearing deep venous thrombosis in the distal left femoral vein, essentially the entire popliteal vein, and calf vein regions. Venous structures elsewhere appear patent on the left. Right common femoral vein patent.  Patient was  started on Eliquis starter kit.  He tolerates well.  Shortness of breath has improved. Denies any previous history of thrombosis, denies any family history of thrombosis.  Grandfather had prostate cancer.  Otherwise no cancer family history. Patient has no  immobilization risk factors.  INTERVAL HISTORY Troy Perry is a 57 y.o. male who has above history reviewed by me today presents for follow up visit for management of chronic left lower extremity DVT Patient is on Eliquis 2.5 mg twice daily.  Patient tolerates well.  Denies any bleeding events he continues to be very active. Occasionally he noticed ankle swelling He was seen by vascular surgeon.  CT abdomen pelvis angiogram showed no Mayer Turner syndrome.   Review of Systems  Constitutional:  Negative for appetite change, chills, fatigue and fever.  HENT:   Negative for hearing loss and voice change.   Eyes:  Negative for eye problems.  Respiratory:  Negative for chest tightness and cough.   Cardiovascular:  Negative for chest pain.  Gastrointestinal:  Negative for abdominal distention, abdominal pain and blood in stool.  Endocrine: Negative for hot flashes.  Genitourinary:  Negative for difficulty urinating and frequency.   Musculoskeletal:  Negative for arthralgias.  Skin:  Negative for itching and rash.  Neurological:  Negative for extremity weakness.  Hematological:  Negative for adenopathy.  Psychiatric/Behavioral:  Negative for confusion.     MEDICAL HISTORY:  Past Medical History:  Diagnosis Date   Allergy    Basal cell carcinoma 12/19/2021   Right mid lateral nose. Nodular pattern. Mohs scheduled.   Chicken pox    GERD (gastroesophageal reflux  disease)     SURGICAL HISTORY: Past Surgical History:  Procedure Laterality Date   ANTERIOR CRUCIATE LIGAMENT REPAIR Left    Basal cell carcinoma removal   03/29/2022   Ordered by Dr. Lacinda Axon   KNEE ARTHROSCOPY W/ MEDIAL COLLATERAL LIGAMENT (MCL) REPAIR Left    KNEE  CARTILAGE SURGERY Bilateral    NECK SURGERY  08/26/2004   C6, C7   SHOULDER SURGERY Left    and arm    SOCIAL HISTORY: Social History   Socioeconomic History   Marital status: Single    Spouse name: Not on file   Number of children: Not on file   Years of education: Not on file   Highest education level: Not on file  Occupational History   Occupation: IT   Tobacco Use   Smoking status: Never   Smokeless tobacco: Never  Vaping Use   Vaping Use: Never used  Substance and Sexual Activity   Alcohol use: Yes    Alcohol/week: 5.0 standard drinks of alcohol    Types: 5 Standard drinks or equivalent per week   Drug use: No   Sexual activity: Yes  Other Topics Concern   Not on file  Social History Narrative   Not on file   Social Determinants of Health   Financial Resource Strain: Not on file  Food Insecurity: Not on file  Transportation Needs: Not on file  Physical Activity: Not on file  Stress: Not on file  Social Connections: Not on file  Intimate Partner Violence: Not on file    FAMILY HISTORY: Family History  Problem Relation Age of Onset   Hyperlipidemia Father    Hypertension Father    Diabetes Father    Arthritis Maternal Grandfather    Cancer Maternal Grandfather        Prostate   Diabetes Paternal Grandfather     ALLERGIES:  has No Known Allergies.  MEDICATIONS:  Current Outpatient Medications  Medication Sig Dispense Refill   fluticasone (FLONASE) 50 MCG/ACT nasal spray Place into both nostrils daily.     melatonin 5 MG TABS Take 5 mg by mouth at bedtime.     Multiple Vitamins-Minerals (MULTIVITAMIN WITH MINERALS) tablet Take 1 tablet by mouth daily.     Omega-3 Fatty Acids (FISH OIL) 1000 MG CAPS Take 1 capsule by mouth in the morning, at noon, and at bedtime.     apixaban (ELIQUIS) 2.5 MG TABS tablet Take 1 tablet (2.5 mg total) by mouth 2 (two) times daily. 60 tablet 5   No current facility-administered medications for this visit.      PHYSICAL EXAMINATION: ECOG PERFORMANCE STATUS: 0 - Asymptomatic Vitals:   10/09/22 1459  BP: (!) 145/87  Pulse: 67  Resp: 18  Temp: (!) 96.4 F (35.8 C)  SpO2: 99%   Filed Weights   10/09/22 1459  Weight: 221 lb 4.8 oz (100.4 kg)     Physical Exam Constitutional:      General: He is not in acute distress. HENT:     Head: Normocephalic and atraumatic.  Eyes:     General: No scleral icterus. Cardiovascular:     Rate and Rhythm: Normal rate and regular rhythm.  Pulmonary:     Effort: Pulmonary effort is normal. No respiratory distress.     Breath sounds: No wheezing.  Abdominal:     General: Bowel sounds are normal. There is no distension.     Palpations: Abdomen is soft.  Musculoskeletal:        General: No  deformity. Normal range of motion.     Cervical back: Normal range of motion and neck supple.     Comments: Trace left ankle edema  Skin:    General: Skin is warm and dry.     Findings: No erythema or rash.  Neurological:     Mental Status: He is alert and oriented to person, place, and time. Mental status is at baseline.     Cranial Nerves: No cranial nerve deficit.     Coordination: Coordination normal.  Psychiatric:        Mood and Affect: Mood normal.     LABORATORY DATA:  I have reviewed the data as listed    Latest Ref Rng & Units 10/09/2022    2:43 PM 06/10/2022    4:00 PM 04/08/2022    1:31 PM  CBC  WBC 4.0 - 10.5 K/uL 4.9  6.2  5.8   Hemoglobin 13.0 - 17.0 g/dL 14.1  14.8  14.1   Hematocrit 39.0 - 52.0 % 40.5  42.0  41.4   Platelets 150 - 400 K/uL 235  264  240       Latest Ref Rng & Units 10/09/2022    2:43 PM 06/10/2022    4:00 PM 04/08/2022    1:31 PM  CMP  Glucose 70 - 99 mg/dL 146  90  138   BUN 6 - 20 mg/dL 18  21  18   $ Creatinine 0.61 - 1.24 mg/dL 0.98  1.07  0.93   Sodium 135 - 145 mmol/L 137  140  135   Potassium 3.5 - 5.1 mmol/L 3.7  4.0  4.2   Chloride 98 - 111 mmol/L 102  99  97   CO2 22 - 32 mmol/L 25  30  27    $ Calcium 8.9 - 10.3 mg/dL 9.4  10.1  9.5   Total Protein 6.5 - 8.1 g/dL 7.6  7.5  7.8   Total Bilirubin 0.3 - 1.2 mg/dL 0.2  0.4  0.4   Alkaline Phos 38 - 126 U/L 51   54   AST 15 - 41 U/L 32  24  32   ALT 0 - 44 U/L 33  33  44    RADIOGRAPHIC STUDIES: I have personally reviewed the radiological images as listed and agreed with the findings in the report. No results found.

## 2022-10-09 NOTE — Assessment & Plan Note (Signed)
#  Chronic left lower extremity DVT 09/06/2020, left lower extremity ultrasound showed persistent nonocclusive chronic appearing DVT within the left popliteal vein, 06/03/2022, CT angiogram showed no Mariel Aloe syndrome. Currently on Eliquis 2.5 mg twice daily prophylactically.   Check antiphospholipid syndrome, Antithrombin III, protein C&S levels.

## 2022-10-09 NOTE — Assessment & Plan Note (Signed)
Continue prophylactic anticoagulation with Eliquis 2.5 mg twice daily.

## 2022-10-10 LAB — PROTEIN C ACTIVITY: Protein C Activity: 137 % (ref 73–180)

## 2022-10-10 LAB — PROTEIN S, TOTAL AND FREE
Protein S Ag, Free: 154 % — ABNORMAL HIGH (ref 61–136)
Protein S Ag, Total: 147 % (ref 60–150)

## 2022-10-12 LAB — ANTIPHOSPHOLIPID SYNDROME PROF
Anticardiolipin IgG: <9 GPL U/mL (ref 0–14)
Anticardiolipin IgM: <9 MPL U/mL (ref 0–12)
DRVVT: 36.7 s (ref 0.0–47.0)
PTT Lupus Anticoagulant: 30.4 s (ref 0.0–43.5)

## 2023-02-18 ENCOUNTER — Other Ambulatory Visit: Payer: Self-pay | Admitting: Oncology

## 2023-02-19 ENCOUNTER — Encounter: Payer: Self-pay | Admitting: Oncology

## 2023-02-21 ENCOUNTER — Other Ambulatory Visit: Payer: Self-pay

## 2023-02-21 MED ORDER — APIXABAN 2.5 MG PO TABS: 2.5000 mg | ORAL_TABLET | Freq: Two times a day (BID) | ORAL | 2 refills | Status: DC

## 2023-04-02 ENCOUNTER — Inpatient Hospital Stay: Payer: BC Managed Care – PPO | Attending: Oncology

## 2023-04-02 DIAGNOSIS — I82532 Chronic embolism and thrombosis of left popliteal vein: Secondary | ICD-10-CM | POA: Insufficient documentation

## 2023-04-02 DIAGNOSIS — I82512 Chronic embolism and thrombosis of left femoral vein: Secondary | ICD-10-CM

## 2023-04-02 DIAGNOSIS — Z7901 Long term (current) use of anticoagulants: Secondary | ICD-10-CM | POA: Insufficient documentation

## 2023-04-02 DIAGNOSIS — Z8042 Family history of malignant neoplasm of prostate: Secondary | ICD-10-CM | POA: Diagnosis not present

## 2023-04-02 LAB — CBC WITH DIFFERENTIAL (CANCER CENTER ONLY)
Abs Immature Granulocytes: 0.02 10*3/uL (ref 0.00–0.07)
Basophils Absolute: 0 10*3/uL (ref 0.0–0.1)
Basophils Relative: 1 %
Eosinophils Absolute: 0.2 10*3/uL (ref 0.0–0.5)
Eosinophils Relative: 3 %
HCT: 42.7 % (ref 39.0–52.0)
Hemoglobin: 14.8 g/dL (ref 13.0–17.0)
Immature Granulocytes: 0 %
Lymphocytes Relative: 29 %
Lymphs Abs: 1.8 10*3/uL (ref 0.7–4.0)
MCH: 31.4 pg (ref 26.0–34.0)
MCHC: 34.7 g/dL (ref 30.0–36.0)
MCV: 90.5 fL (ref 80.0–100.0)
Monocytes Absolute: 0.8 10*3/uL (ref 0.1–1.0)
Monocytes Relative: 13 %
Neutro Abs: 3.2 10*3/uL (ref 1.7–7.7)
Neutrophils Relative %: 54 %
Platelet Count: 221 10*3/uL (ref 150–400)
RBC: 4.72 MIL/uL (ref 4.22–5.81)
RDW: 12 % (ref 11.5–15.5)
WBC Count: 6 10*3/uL (ref 4.0–10.5)
nRBC: 0 % (ref 0.0–0.2)

## 2023-04-02 LAB — CMP (CANCER CENTER ONLY)
ALT: 37 U/L (ref 0–44)
AST: 31 U/L (ref 15–41)
Albumin: 4.5 g/dL (ref 3.5–5.0)
Alkaline Phosphatase: 51 U/L (ref 38–126)
Anion gap: 9 (ref 5–15)
BUN: 19 mg/dL (ref 6–20)
CO2: 26 mmol/L (ref 22–32)
Calcium: 9.5 mg/dL (ref 8.9–10.3)
Chloride: 102 mmol/L (ref 98–111)
Creatinine: 1.03 mg/dL (ref 0.61–1.24)
GFR, Estimated: >60 mL/min (ref >60)
Glucose, Bld: 105 mg/dL — ABNORMAL HIGH (ref 70–99)
Potassium: 4.4 mmol/L (ref 3.5–5.1)
Sodium: 137 mmol/L (ref 135–145)
Total Bilirubin: 0.5 mg/dL (ref 0.3–1.2)
Total Protein: 7.8 g/dL (ref 6.5–8.1)

## 2023-04-09 ENCOUNTER — Inpatient Hospital Stay: Payer: BC Managed Care – PPO | Admitting: Oncology

## 2023-04-09 ENCOUNTER — Encounter: Payer: Self-pay | Admitting: Oncology

## 2023-04-09 VITALS — BP 141/77 | HR 62 | Temp 97.0°F | Resp 18 | Wt 220.8 lb

## 2023-04-09 DIAGNOSIS — I825Y2 Chronic embolism and thrombosis of unspecified deep veins of left proximal lower extremity: Secondary | ICD-10-CM

## 2023-04-09 DIAGNOSIS — Z8042 Family history of malignant neoplasm of prostate: Secondary | ICD-10-CM | POA: Diagnosis not present

## 2023-04-09 DIAGNOSIS — D6852 Prothrombin gene mutation: Secondary | ICD-10-CM | POA: Diagnosis not present

## 2023-04-09 DIAGNOSIS — Z7901 Long term (current) use of anticoagulants: Secondary | ICD-10-CM | POA: Diagnosis not present

## 2023-04-09 DIAGNOSIS — I82532 Chronic embolism and thrombosis of left popliteal vein: Secondary | ICD-10-CM | POA: Diagnosis not present

## 2023-04-09 MED ORDER — APIXABAN 2.5 MG PO TABS: 2.5000 mg | ORAL_TABLET | Freq: Two times a day (BID) | ORAL | 1 refills | Status: DC

## 2023-04-09 NOTE — Assessment & Plan Note (Signed)
Continue prophylactic anticoagulation with Eliquis 2.5 mg twice daily.

## 2023-04-09 NOTE — Assessment & Plan Note (Addendum)
#  Chronic left lower extremity DVT 09/06/2020, left lower extremity ultrasound showed persistent nonocclusive chronic appearing DVT within the left popliteal vein, 06/03/2022, CT angiogram showed no Toribio Harbour syndrome. continue Eliquis 2.5 mg twice daily prophylactically.

## 2023-04-09 NOTE — Progress Notes (Signed)
Hematology/Oncology Progress note Telephone:(336) [phone] Fax:(336) [phone]    CHIEF COMPLAINTS/REASON FOR VISIT:  Chronic DVT  ASSESSMENT & PLAN:   Chronic deep vein thrombosis (DVT) (HCC) #Chronic left lower extremity DVT 09/06/2020, left lower extremity ultrasound showed persistent nonocclusive chronic appearing DVT within the left popliteal vein, 06/03/2022, CT angiogram showed no Toribio Harbour syndrome. continue Eliquis 2.5 mg twice daily prophylactically.    Heterozygous for prothrombin G20210A mutation (HCC) Continue prophylactic anticoagulation with Eliquis 2.5 mg twice daily.  Orders Placed This Encounter  Procedures   CBC with Differential ([facility] Only)    Standing Status:   Future    Standing Expiration Date:   04/08/2024   CMP ([facility] only)    Standing Status:   Future    Standing Expiration Date:   04/08/2024   Follow-up in 6 months. All questions were answered. The patient knows to call the clinic with any problems, questions or concerns.  Rickard Patience, MD, PhD [facility] Hematology Oncology 04/09/2023   HISTORY OF PRESENTING ILLNESS:   Troy Perry is a  57 y.o.  male with PMH listed below was seen in consultation at the request of  Lorre Munroe, NP  for evaluation of acute DVT  Patient reports being very active.  2 weeks ago, he has experienced some shortness of breath with exertion as well as left side chest wall discomfort/pain.  A few days later, he reports tightness of the left lower extremity, some calf discomfort.  Patient was seen by primary care provider on 11/27/2020 and left lower extremity venous ultrasound was obtained.  Study showed Acute appearing deep venous thrombosis in the distal left femoral vein, essentially the entire popliteal vein, and calf vein regions. Venous structures elsewhere appear patent on the left. Right common femoral vein patent.  Patient was started on Eliquis starter kit.  He tolerates well.  Shortness of breath has  improved. Denies any previous history of thrombosis, denies any family history of thrombosis.  Grandfather had prostate cancer.  Otherwise no cancer family history. Patient has no  immobilization risk factors.  CT abdomen pelvis angiogram showed no Mayer Turner syndrome.  INTERVAL HISTORY Troy Perry is a 57 y.o. male who has above history reviewed by me today presents for follow up visit for management of chronic left lower extremity DVT Patient is on Eliquis 2.5 mg twice daily.  Patient tolerates well.  Denies any bleeding events he continues to be very active. Occasionally he noticed ankle swelling    Review of Systems  Constitutional:  Negative for appetite change, chills, fatigue and fever.  HENT:   Negative for hearing loss and voice change.   Eyes:  Negative for eye problems.  Respiratory:  Negative for chest tightness and cough.   Cardiovascular:  Negative for chest pain.  Gastrointestinal:  Negative for abdominal distention, abdominal pain and blood in stool.  Endocrine: Negative for hot flashes.  Genitourinary:  Negative for difficulty urinating and frequency.   Musculoskeletal:  Negative for arthralgias.  Skin:  Negative for itching and rash.  Neurological:  Negative for extremity weakness.  Hematological:  Negative for adenopathy.  Psychiatric/Behavioral:  Negative for confusion.     MEDICAL HISTORY:  Past Medical History:  Diagnosis Date   Allergy    Basal cell carcinoma 12/19/2021   Right mid lateral nose. Nodular pattern. Mohs scheduled.   Chicken pox    GERD (gastroesophageal reflux disease)     SURGICAL HISTORY: Past Surgical History:  Procedure Laterality Date  ANTERIOR CRUCIATE LIGAMENT REPAIR Left    Basal cell carcinoma removal   03/29/2022   Ordered by Dr. Adriana Simas   KNEE ARTHROSCOPY W/ MEDIAL COLLATERAL LIGAMENT (MCL) REPAIR Left    KNEE CARTILAGE SURGERY Bilateral    NECK SURGERY  08/26/2004   C6, C7   SHOULDER SURGERY Left    and arm     SOCIAL HISTORY: Social History   Socioeconomic History   Marital status: Single    Spouse name: Not on file   Number of children: Not on file   Years of education: Not on file   Highest education level: Not on file  Occupational History   Occupation: IT   Tobacco Use   Smoking status: Never   Smokeless tobacco: Never  Vaping Use   Vaping status: Never Used  Substance and Sexual Activity   Alcohol use: Yes    Alcohol/week: 5.0 standard drinks of alcohol    Types: 5 Standard drinks or equivalent per week   Drug use: No   Sexual activity: Yes  Other Topics Concern   Not on file  Social History Narrative   Not on file   Social Determinants of Health   Financial Resource Strain: Not on file  Food Insecurity: Not on file  Transportation Needs: Not on file  Physical Activity: Not on file  Stress: Not on file  Social Connections: Not on file  Intimate Partner Violence: Not on file    FAMILY HISTORY: Family History  Problem Relation Age of Onset   Hyperlipidemia Father    Hypertension Father    Diabetes Father    Arthritis Maternal Grandfather    Cancer Maternal Grandfather        Prostate   Diabetes Paternal Grandfather     ALLERGIES:  has No Known Allergies.  MEDICATIONS:  Current Outpatient Medications  Medication Sig Dispense Refill   fluticasone (FLONASE) 50 MCG/ACT nasal spray Place into both nostrils daily.     melatonin 5 MG TABS Take 5 mg by mouth at bedtime.     Multiple Vitamins-Minerals (MULTIVITAMIN WITH MINERALS) tablet Take 1 tablet by mouth daily.     Omega-3 Fatty Acids (FISH OIL) 1000 MG CAPS Take 1 capsule by mouth in the morning, at noon, and at bedtime.     apixaban (ELIQUIS) 2.5 MG TABS tablet Take 1 tablet (2.5 mg total) by mouth 2 (two) times daily. 180 tablet 1   No current facility-administered medications for this visit.     PHYSICAL EXAMINATION: ECOG PERFORMANCE STATUS: 0 - Asymptomatic Vitals:   04/09/23 0945  BP: (!)  141/77  Pulse: 62  Resp: 18  Temp: (!) 97 F (36.1 C)  SpO2: 97%   Filed Weights   04/09/23 0945  Weight: 220 lb 12.8 oz (100.2 kg)     Physical Exam Constitutional:      General: He is not in acute distress. HENT:     Head: Normocephalic and atraumatic.  Eyes:     General: No scleral icterus. Cardiovascular:     Rate and Rhythm: Normal rate and regular rhythm.  Pulmonary:     Effort: Pulmonary effort is normal. No respiratory distress.     Breath sounds: No wheezing.  Abdominal:     General: Bowel sounds are normal. There is no distension.     Palpations: Abdomen is soft.  Musculoskeletal:        General: No deformity. Normal range of motion.     Cervical back: Normal range of motion and  neck supple.     Comments: Trace left ankle edema  Skin:    General: Skin is warm and dry.     Findings: No erythema or rash.  Neurological:     Mental Status: He is alert and oriented to person, place, and time. Mental status is at baseline.     Cranial Nerves: No cranial nerve deficit.     Coordination: Coordination normal.  Psychiatric:        Mood and Affect: Mood normal.     LABORATORY DATA:  I have reviewed the data as listed    Latest Ref Rng & Units 04/02/2023    8:32 AM 10/09/2022    2:43 PM 06/10/2022    4:00 PM  CBC  WBC 4.0 - 10.5 K/uL 6.0  4.9  6.2   Hemoglobin 13.0 - 17.0 g/dL 16.1  09.6  04.5   Hematocrit 39.0 - 52.0 % 42.7  40.5  42.0   Platelets 150 - 400 K/uL 221  235  264       Latest Ref Rng & Units 04/02/2023    8:32 AM 10/09/2022    2:43 PM 06/10/2022    4:00 PM  CMP  Glucose 70 - 99 mg/dL 409  811  90   BUN 6 - 20 mg/dL 19  18  21    Creatinine 0.61 - 1.24 mg/dL 9.14  7.82  9.56   Sodium 135 - 145 mmol/L 137  137  140   Potassium 3.5 - 5.1 mmol/L 4.4  3.7  4.0   Chloride 98 - 111 mmol/L 102  102  99   CO2 22 - 32 mmol/L 26  25  30    Calcium 8.9 - 10.3 mg/dL 9.5  9.4  21.3   Total Protein 6.5 - 8.1 g/dL 7.8  7.6  7.5   Total Bilirubin 0.3 - 1.2  mg/dL 0.5  0.2  0.4   Alkaline Phos 38 - 126 U/L 51  51    AST 15 - 41 U/L 31  32  24   ALT 0 - 44 U/L 37  33  33    RADIOGRAPHIC STUDIES: I have personally reviewed the radiological images as listed and agreed with the findings in the report. No results found.

## 2023-04-19 ENCOUNTER — Other Ambulatory Visit: Payer: Self-pay | Admitting: Oncology

## 2023-05-14 DIAGNOSIS — M5442 Lumbago with sciatica, left side: Secondary | ICD-10-CM | POA: Diagnosis not present

## 2023-05-14 DIAGNOSIS — M5416 Radiculopathy, lumbar region: Secondary | ICD-10-CM | POA: Diagnosis not present

## 2023-05-14 DIAGNOSIS — M160 Bilateral primary osteoarthritis of hip: Secondary | ICD-10-CM | POA: Diagnosis not present

## 2023-05-14 DIAGNOSIS — M5137 Other intervertebral disc degeneration, lumbosacral region: Secondary | ICD-10-CM | POA: Diagnosis not present

## 2023-05-15 ENCOUNTER — Encounter: Payer: BC Managed Care – PPO | Admitting: Dermatology

## 2023-07-15 DIAGNOSIS — M542 Cervicalgia: Secondary | ICD-10-CM | POA: Diagnosis not present

## 2023-07-15 DIAGNOSIS — G959 Disease of spinal cord, unspecified: Secondary | ICD-10-CM | POA: Diagnosis not present

## 2023-08-04 DIAGNOSIS — Z1389 Encounter for screening for other disorder: Secondary | ICD-10-CM | POA: Diagnosis not present

## 2023-09-03 DIAGNOSIS — B9689 Other specified bacterial agents as the cause of diseases classified elsewhere: Secondary | ICD-10-CM | POA: Diagnosis not present

## 2023-09-03 DIAGNOSIS — J019 Acute sinusitis, unspecified: Secondary | ICD-10-CM | POA: Diagnosis not present

## 2023-09-03 DIAGNOSIS — R051 Acute cough: Secondary | ICD-10-CM | POA: Diagnosis not present

## 2023-09-03 DIAGNOSIS — J209 Acute bronchitis, unspecified: Secondary | ICD-10-CM | POA: Diagnosis not present

## 2023-09-03 DIAGNOSIS — Z03818 Encounter for observation for suspected exposure to other biological agents ruled out: Secondary | ICD-10-CM | POA: Diagnosis not present

## 2023-09-23 ENCOUNTER — Other Ambulatory Visit: Payer: Self-pay | Admitting: Student

## 2023-09-23 DIAGNOSIS — M542 Cervicalgia: Secondary | ICD-10-CM

## 2023-10-06 ENCOUNTER — Inpatient Hospital Stay: Payer: BC Managed Care – PPO | Attending: Oncology

## 2023-10-06 ENCOUNTER — Other Ambulatory Visit: Payer: Self-pay

## 2023-10-06 DIAGNOSIS — D6852 Prothrombin gene mutation: Secondary | ICD-10-CM | POA: Diagnosis not present

## 2023-10-06 LAB — CMP (CANCER CENTER ONLY)
ALT: 36 U/L (ref 0–44)
AST: 31 U/L (ref 15–41)
Albumin: 4.3 g/dL (ref 3.5–5.0)
Alkaline Phosphatase: 50 U/L (ref 38–126)
Anion gap: 11 (ref 5–15)
BUN: 20 mg/dL (ref 6–20)
CO2: 24 mmol/L (ref 22–32)
Calcium: 9.4 mg/dL (ref 8.9–10.3)
Chloride: 103 mmol/L (ref 98–111)
Creatinine: 1.04 mg/dL (ref 0.61–1.24)
GFR, Estimated: >60 mL/min (ref >60)
Glucose, Bld: 120 mg/dL — ABNORMAL HIGH (ref 70–99)
Potassium: 4.1 mmol/L (ref 3.5–5.1)
Sodium: 138 mmol/L (ref 135–145)
Total Bilirubin: 0.8 mg/dL (ref 0.0–1.2)
Total Protein: 7.6 g/dL (ref 6.5–8.1)

## 2023-10-06 LAB — CBC WITH DIFFERENTIAL (CANCER CENTER ONLY)
Abs Immature Granulocytes: 0.02 10*3/uL (ref 0.00–0.07)
Basophils Absolute: 0 10*3/uL (ref 0.0–0.1)
Basophils Relative: 1 %
Eosinophils Absolute: 0.2 10*3/uL (ref 0.0–0.5)
Eosinophils Relative: 4 %
HCT: 42.1 % (ref 39.0–52.0)
Hemoglobin: 14.6 g/dL (ref 13.0–17.0)
Immature Granulocytes: 0 %
Lymphocytes Relative: 36 %
Lymphs Abs: 1.8 10*3/uL (ref 0.7–4.0)
MCH: 31.8 pg (ref 26.0–34.0)
MCHC: 34.7 g/dL (ref 30.0–36.0)
MCV: 91.7 fL (ref 80.0–100.0)
Monocytes Absolute: 0.5 10*3/uL (ref 0.1–1.0)
Monocytes Relative: 11 %
Neutro Abs: 2.4 10*3/uL (ref 1.7–7.7)
Neutrophils Relative %: 48 %
Platelet Count: 239 10*3/uL (ref 150–400)
RBC: 4.59 MIL/uL (ref 4.22–5.81)
RDW: 12.5 % (ref 11.5–15.5)
WBC Count: 4.9 10*3/uL (ref 4.0–10.5)
nRBC: 0 % (ref 0.0–0.2)

## 2023-10-08 ENCOUNTER — Other Ambulatory Visit: Payer: Self-pay | Admitting: Student

## 2023-10-08 NOTE — Discharge Instructions (Signed)

## 2023-10-09 ENCOUNTER — Ambulatory Visit
Admission: RE | Admit: 2023-10-09 | Discharge: 2023-10-09 | Disposition: A | Discharge status: Home / Self Care | Payer: BC Managed Care – PPO | Source: Ambulatory Visit | Attending: Student | Admitting: Student

## 2023-10-09 DIAGNOSIS — M542 Cervicalgia: Secondary | ICD-10-CM

## 2023-10-09 MED ORDER — ONDANSETRON HCL 4 MG/2ML IJ SOLN: 4.0000 mg | Freq: Once | INTRAMUSCULAR | Status: DC | PRN

## 2023-10-09 MED ORDER — DIAZEPAM 5 MG PO TABS: 10.0000 mg | ORAL_TABLET | Freq: Once | ORAL | Status: DC

## 2023-10-09 MED ORDER — MEPERIDINE HCL 50 MG/ML IJ SOLN: 50.0000 mg | Freq: Once | INTRAMUSCULAR | Status: DC | PRN

## 2023-10-13 ENCOUNTER — Encounter: Payer: Self-pay | Admitting: Oncology

## 2023-10-13 ENCOUNTER — Inpatient Hospital Stay: Payer: BC Managed Care – PPO | Admitting: Oncology

## 2023-10-13 NOTE — Assessment & Plan Note (Deleted)
#  Chronic left lower extremity DVT 09/06/2020, left lower extremity ultrasound showed persistent nonocclusive chronic appearing DVT within the left popliteal vein, 06/03/2022, CT angiogram showed no Toribio Harbour syndrome. continue Eliquis 2.5 mg twice daily prophylactically.

## 2023-10-14 DIAGNOSIS — G959 Disease of spinal cord, unspecified: Secondary | ICD-10-CM | POA: Diagnosis not present

## 2023-10-14 DIAGNOSIS — M542 Cervicalgia: Secondary | ICD-10-CM | POA: Diagnosis not present

## 2023-10-14 DIAGNOSIS — M5440 Lumbago with sciatica, unspecified side: Secondary | ICD-10-CM | POA: Diagnosis not present

## 2023-10-20 ENCOUNTER — Other Ambulatory Visit: Payer: Self-pay | Admitting: Neurosurgery

## 2023-10-20 ENCOUNTER — Other Ambulatory Visit: Payer: Self-pay | Admitting: Student

## 2023-10-20 DIAGNOSIS — M5440 Lumbago with sciatica, unspecified side: Secondary | ICD-10-CM

## 2023-10-20 DIAGNOSIS — M542 Cervicalgia: Secondary | ICD-10-CM

## 2023-10-22 ENCOUNTER — Other Ambulatory Visit: Payer: Self-pay | Admitting: Oncology

## 2023-10-27 NOTE — Discharge Instructions (Signed)

## 2023-10-28 ENCOUNTER — Ambulatory Visit
Admission: RE | Admit: 2023-10-28 | Discharge: 2023-10-28 | Disposition: A | Discharge status: Home / Self Care | Payer: BC Managed Care – PPO | Source: Ambulatory Visit | Attending: Neurosurgery | Admitting: Neurosurgery

## 2023-10-28 ENCOUNTER — Other Ambulatory Visit: Payer: BC Managed Care – PPO

## 2023-10-28 DIAGNOSIS — M4807 Spinal stenosis, lumbosacral region: Secondary | ICD-10-CM | POA: Diagnosis not present

## 2023-10-28 DIAGNOSIS — M542 Cervicalgia: Secondary | ICD-10-CM

## 2023-10-28 DIAGNOSIS — M47816 Spondylosis without myelopathy or radiculopathy, lumbar region: Secondary | ICD-10-CM | POA: Diagnosis not present

## 2023-10-28 DIAGNOSIS — Z981 Arthrodesis status: Secondary | ICD-10-CM | POA: Diagnosis not present

## 2023-10-28 DIAGNOSIS — M48061 Spinal stenosis, lumbar region without neurogenic claudication: Secondary | ICD-10-CM | POA: Diagnosis not present

## 2023-10-28 DIAGNOSIS — M5440 Lumbago with sciatica, unspecified side: Secondary | ICD-10-CM

## 2023-10-28 DIAGNOSIS — M47812 Spondylosis without myelopathy or radiculopathy, cervical region: Secondary | ICD-10-CM | POA: Diagnosis not present

## 2023-10-28 MED ORDER — MEPERIDINE HCL 50 MG/ML IJ SOLN: 50.0000 mg | Freq: Once | INTRAMUSCULAR | Status: DC | PRN

## 2023-10-28 MED ORDER — DIAZEPAM 5 MG PO TABS: 10.0000 mg | ORAL_TABLET | Freq: Once | ORAL | Status: DC

## 2023-10-28 MED ORDER — ONDANSETRON HCL 4 MG/2ML IJ SOLN: 4.0000 mg | Freq: Once | INTRAMUSCULAR | Status: DC | PRN

## 2023-10-28 MED ORDER — IOPAMIDOL (ISOVUE-M 300) INJECTION 61%
10.0000 mL | Freq: Once | INTRAMUSCULAR | Status: AC | PRN
  Administered 2023-10-28: 10 mL via INTRATHECAL

## 2023-11-06 DIAGNOSIS — Z6831 Body mass index (BMI) 31.0-31.9, adult: Secondary | ICD-10-CM | POA: Diagnosis not present

## 2023-11-06 DIAGNOSIS — M5440 Lumbago with sciatica, unspecified side: Secondary | ICD-10-CM | POA: Diagnosis not present

## 2023-11-06 DIAGNOSIS — M542 Cervicalgia: Secondary | ICD-10-CM | POA: Diagnosis not present

## 2024-01-05 ENCOUNTER — Other Ambulatory Visit: Payer: Self-pay

## 2024-01-05 DIAGNOSIS — D6852 Prothrombin gene mutation: Secondary | ICD-10-CM

## 2024-01-06 ENCOUNTER — Encounter: Payer: Self-pay | Admitting: Oncology

## 2024-01-06 ENCOUNTER — Inpatient Hospital Stay

## 2024-01-06 ENCOUNTER — Inpatient Hospital Stay: Attending: Oncology | Admitting: Oncology

## 2024-01-06 VITALS — BP 134/81 | HR 64 | Temp 97.3°F | Resp 18 | Wt 208.4 lb

## 2024-01-06 DIAGNOSIS — D6852 Prothrombin gene mutation: Secondary | ICD-10-CM | POA: Insufficient documentation

## 2024-01-06 DIAGNOSIS — Z8042 Family history of malignant neoplasm of prostate: Secondary | ICD-10-CM | POA: Insufficient documentation

## 2024-01-06 DIAGNOSIS — I825Y2 Chronic embolism and thrombosis of unspecified deep veins of left proximal lower extremity: Secondary | ICD-10-CM

## 2024-01-06 DIAGNOSIS — Z86718 Personal history of other venous thrombosis and embolism: Secondary | ICD-10-CM | POA: Insufficient documentation

## 2024-01-06 DIAGNOSIS — Z7901 Long term (current) use of anticoagulants: Secondary | ICD-10-CM | POA: Insufficient documentation

## 2024-01-06 LAB — CBC WITH DIFFERENTIAL (CANCER CENTER ONLY)
Abs Immature Granulocytes: 0.02 10*3/uL (ref 0.00–0.07)
Basophils Absolute: 0 10*3/uL (ref 0.0–0.1)
Basophils Relative: 1 %
Eosinophils Absolute: 0.1 10*3/uL (ref 0.0–0.5)
Eosinophils Relative: 3 %
HCT: 41.5 % (ref 39.0–52.0)
Hemoglobin: 14.3 g/dL (ref 13.0–17.0)
Immature Granulocytes: 0 %
Lymphocytes Relative: 34 %
Lymphs Abs: 1.6 10*3/uL (ref 0.7–4.0)
MCH: 32.3 pg (ref 26.0–34.0)
MCHC: 34.5 g/dL (ref 30.0–36.0)
MCV: 93.7 fL (ref 80.0–100.0)
Monocytes Absolute: 0.5 10*3/uL (ref 0.1–1.0)
Monocytes Relative: 11 %
Neutro Abs: 2.5 10*3/uL (ref 1.7–7.7)
Neutrophils Relative %: 51 %
Platelet Count: 231 10*3/uL (ref 150–400)
RBC: 4.43 MIL/uL (ref 4.22–5.81)
RDW: 12.1 % (ref 11.5–15.5)
WBC Count: 4.8 10*3/uL (ref 4.0–10.5)
nRBC: 0 % (ref 0.0–0.2)

## 2024-01-06 LAB — CMP (CANCER CENTER ONLY)
ALT: 27 U/L (ref 0–44)
AST: 32 U/L (ref 15–41)
Albumin: 4.2 g/dL (ref 3.5–5.0)
Alkaline Phosphatase: 53 U/L (ref 38–126)
Anion gap: 9 (ref 5–15)
BUN: 26 mg/dL — ABNORMAL HIGH (ref 6–20)
CO2: 27 mmol/L (ref 22–32)
Calcium: 9.1 mg/dL (ref 8.9–10.3)
Chloride: 101 mmol/L (ref 98–111)
Creatinine: 1.02 mg/dL (ref 0.61–1.24)
GFR, Estimated: >60 mL/min (ref >60)
Glucose, Bld: 90 mg/dL (ref 70–99)
Potassium: 4.1 mmol/L (ref 3.5–5.1)
Sodium: 137 mmol/L (ref 135–145)
Total Bilirubin: 0.6 mg/dL (ref 0.0–1.2)
Total Protein: 7.4 g/dL (ref 6.5–8.1)

## 2024-01-06 MED ORDER — RIVAROXABAN 10 MG PO TABS: 10.0000 mg | ORAL_TABLET | Freq: Every day | ORAL | 5 refills | Status: DC

## 2024-01-06 NOTE — Assessment & Plan Note (Addendum)
Continue prophylactic anticoagulation

## 2024-01-06 NOTE — Assessment & Plan Note (Addendum)
#  Chronic left lower extremity DVT-unprovoked 09/06/2020, left lower extremity ultrasound showed persistent nonocclusive chronic appearing DVT within the left popliteal vein, 06/03/2022, CT angiogram showed no Loyola Rummage syndrome. Patient currently takes Eliquis  2.5 mg daily [self dose reduction].  Patient would like to stop anticoagulation. Discussed with patient that Eliquis  2.5 mg twice daily is suggested prophylactic dose. Discussed about rational long-term anticoagulation prophylaxis due to the unprovoked DVT nature and prothrombin gene mutation heterozygous carrier state. Patient is interested in switching to Xarelto 10 mg daily.  New prescription was sent to pharmacy. If patient eventually decide to stop long-term anticoagulation, I recommend him to switch to aspirin 81 mg daily and be aware about red flag symptoms.

## 2024-01-06 NOTE — Progress Notes (Signed)
 Hematology/Oncology Progress note Telephone:([phone]) [phone] Fax:(336) [phone]    CHIEF COMPLAINTS/REASON FOR VISIT:  Chronic DVT  ASSESSMENT & PLAN:   Chronic deep vein thrombosis (DVT) (HCC) #Chronic left lower extremity DVT-unprovoked 09/06/2020, left lower extremity ultrasound showed persistent nonocclusive chronic appearing DVT within the left popliteal vein, 06/03/2022, CT angiogram showed no Loyola Rummage syndrome. Patient currently takes Eliquis  2.5 mg daily [self dose reduction].  Patient would like to stop anticoagulation. Discussed with patient that Eliquis  2.5 mg twice daily is suggested prophylactic dose. Discussed about rational long-term anticoagulation prophylaxis due to the unprovoked DVT nature and prothrombin gene mutation heterozygous carrier state. Patient is interested in switching to Xarelto 10 mg daily.  New prescription was sent to pharmacy. If patient eventually decide to stop long-term anticoagulation, I recommend him to switch to aspirin 81 mg daily and be aware about red flag symptoms.  Heterozygous for prothrombin G20210A mutation (HCC) Continue prophylactic anticoagulation   Orders Placed This Encounter  Procedures   CBC with Differential (Cancer Center Only)    Standing Status:   Future    Expected Date:   07/08/2024    Expiration Date:   01/05/2025   CMP (Cancer Center only)    Standing Status:   Future    Expected Date:   07/08/2024    Expiration Date:   01/05/2025   Follow-up in 6 months. All questions were answered. The patient knows to call the clinic with any problems, questions or concerns.  Timmy Forbes, MD, PhD [facility] Hematology Oncology 01/06/2024   HISTORY OF PRESENTING ILLNESS:   Troy Perry is a  58 y.o.  male with PMH listed below was seen in consultation at the request of  Carollynn Cirri, NP  for evaluation of acute DVT  Patient reports being very active.  2 weeks ago, he has experienced some shortness of breath with exertion as  well as left side chest wall discomfort/pain.  A few days later, he reports tightness of the left lower extremity, some calf discomfort.  Patient was seen by primary care provider on 11/27/2020 and left lower extremity venous ultrasound was obtained.  Study showed Acute appearing deep venous thrombosis in the distal left femoral vein, essentially the entire popliteal vein, and calf vein regions. Venous structures elsewhere appear patent on the left. Right common femoral vein patent.  Patient was started on Eliquis  starter kit.  He tolerates well.  Shortness of breath has improved. Denies any previous history of thrombosis, denies any family history of thrombosis.  Grandfather had prostate cancer.  Otherwise no cancer family history. Patient has no  immobilization risk factors.  CT abdomen pelvis angiogram showed no Mayer Turner syndrome.  INTERVAL HISTORY Troy Perry is a 58 y.o. male who has above history reviewed by me today presents for follow up visit for management of chronic left lower extremity DVT Denies any bleeding events he continues to be very active. Intentionally lost weight.  Patient currently takes  Eliquis  2.5 mg  daily- he feels he can not tolerate side effects He recently lost his job and is in the process of job hunting. He also expressed concern of coverage of his medication.    Review of Systems  Constitutional:  Negative for appetite change, chills, fatigue and fever.  HENT:   Negative for hearing loss and voice change.   Eyes:  Negative for eye problems.  Respiratory:  Negative for chest tightness and cough.   Cardiovascular:  Negative for chest pain.  Gastrointestinal:  Negative for abdominal distention, abdominal pain and blood in stool.  Endocrine: Negative for hot flashes.  Genitourinary:  Negative for difficulty urinating and frequency.   Musculoskeletal:  Negative for arthralgias.  Skin:  Negative for itching and rash.  Neurological:  Negative for extremity  weakness.  Hematological:  Negative for adenopathy.  Psychiatric/Behavioral:  Negative for confusion.     MEDICAL HISTORY:  Past Medical History:  Diagnosis Date   Allergy    Basal cell carcinoma 12/19/2021   Right mid lateral nose. Nodular pattern. Mohs scheduled.   Chicken pox    GERD (gastroesophageal reflux disease)     SURGICAL HISTORY: Past Surgical History:  Procedure Laterality Date   ANTERIOR CRUCIATE LIGAMENT REPAIR Left    Basal cell carcinoma removal   03/29/2022   Ordered by Dr. Debrah Fan   KNEE ARTHROSCOPY W/ MEDIAL COLLATERAL LIGAMENT (MCL) REPAIR Left    KNEE CARTILAGE SURGERY Bilateral    NECK SURGERY  08/26/2004   C6, C7   SHOULDER SURGERY Left    and arm    SOCIAL HISTORY: Social History   Socioeconomic History   Marital status: Single    Spouse name: Not on file   Number of children: Not on file   Years of education: Not on file   Highest education level: Not on file  Occupational History   Occupation: IT   Tobacco Use   Smoking status: Never   Smokeless tobacco: Never  Vaping Use   Vaping status: Never Used  Substance and Sexual Activity   Alcohol use: Yes    Alcohol/week: 5.0 standard drinks of alcohol    Types: 5 Standard drinks or equivalent per week   Drug use: No   Sexual activity: Yes  Other Topics Concern   Not on file  Social History Narrative   Not on file   Social Drivers of Health   Financial Resource Strain: Not on file  Food Insecurity: Not on file  Transportation Needs: Not on file  Physical Activity: Not on file  Stress: Not on file  Social Connections: Not on file  Intimate Partner Violence: Not on file    FAMILY HISTORY: Family History  Problem Relation Age of Onset   Hyperlipidemia Father    Hypertension Father    Diabetes Father    Arthritis Maternal Grandfather    Cancer Maternal Grandfather        Prostate   Diabetes Paternal Grandfather     ALLERGIES:  has no known allergies.  MEDICATIONS:  Current  Outpatient Medications  Medication Sig Dispense Refill   fluticasone (FLONASE) 50 MCG/ACT nasal spray Place into both nostrils daily.     melatonin 5 MG TABS Take 5 mg by mouth at bedtime.     Multiple Vitamins-Minerals (MULTIVITAMIN WITH MINERALS) tablet Take 1 tablet by mouth daily.     Omega-3 Fatty Acids (FISH OIL) 1000 MG CAPS Take 1 capsule by mouth in the morning, at noon, and at bedtime.     rivaroxaban (XARELTO) 10 MG TABS tablet Take 1 tablet (10 mg total) by mouth daily. 30 tablet 5   No current facility-administered medications for this visit.     PHYSICAL EXAMINATION: ECOG PERFORMANCE STATUS: 0 - Asymptomatic Vitals:   01/06/24 1328  BP: 134/81  Pulse: 64  Resp: 18  Temp: (!) 97.3 F (36.3 C)  SpO2: 99%   Filed Weights   01/06/24 1328  Weight: 208 lb 6.4 oz (94.5 kg)     Physical Exam Constitutional:  General: He is not in acute distress. HENT:     Head: Normocephalic and atraumatic.  Eyes:     General: No scleral icterus. Cardiovascular:     Rate and Rhythm: Normal rate and regular rhythm.  Pulmonary:     Effort: Pulmonary effort is normal. No respiratory distress.     Breath sounds: No wheezing.  Abdominal:     General: Bowel sounds are normal. There is no distension.     Palpations: Abdomen is soft.  Musculoskeletal:        General: No deformity. Normal range of motion.     Cervical back: Normal range of motion and neck supple.     Comments: Trace left ankle edema  Skin:    General: Skin is warm and dry.     Findings: No erythema or rash.  Neurological:     Mental Status: He is alert and oriented to person, place, and time. Mental status is at baseline.     Cranial Nerves: No cranial nerve deficit.     Coordination: Coordination normal.  Psychiatric:        Mood and Affect: Mood normal.     LABORATORY DATA:  I have reviewed the data as listed    Latest Ref Rng & Units 01/06/2024    1:21 PM 10/06/2023    8:51 AM 04/02/2023    8:32 AM   CBC  WBC 4.0 - 10.5 K/uL 4.8  4.9  6.0   Hemoglobin 13.0 - 17.0 g/dL 82.9  56.2  13.0   Hematocrit 39.0 - 52.0 % 41.5  42.1  42.7   Platelets 150 - 400 K/uL 231  239  221       Latest Ref Rng & Units 01/06/2024    1:21 PM 10/06/2023    8:51 AM 04/02/2023    8:32 AM  CMP  Glucose 70 - 99 mg/dL 90  865  784   BUN 6 - 20 mg/dL 26  20  19    Creatinine 0.61 - 1.24 mg/dL 6.96  2.95  2.84   Sodium 135 - 145 mmol/L 137  138  137   Potassium 3.5 - 5.1 mmol/L 4.1  4.1  4.4   Chloride 98 - 111 mmol/L 101  103  102   CO2 22 - 32 mmol/L 27  24  26    Calcium 8.9 - 10.3 mg/dL 9.1  9.4  9.5   Total Protein 6.5 - 8.1 g/dL 7.4  7.6  7.8   Total Bilirubin 0.0 - 1.2 mg/dL 0.6  0.8  0.5   Alkaline Phos 38 - 126 U/L 53  50  51   AST 15 - 41 U/L 32  31  31   ALT 0 - 44 U/L 27  36  37    RADIOGRAPHIC STUDIES: I have personally reviewed the radiological images as listed and agreed with the findings in the report. No results found.

## 2024-01-12 ENCOUNTER — Encounter: Payer: Self-pay | Admitting: Oncology

## 2024-07-27 ENCOUNTER — Inpatient Hospital Stay

## 2024-07-27 ENCOUNTER — Inpatient Hospital Stay: Admitting: Oncology

## 2024-07-27 NOTE — Assessment & Plan Note (Deleted)
#  Chronic left lower extremity DVT-unprovoked 09/06/2020, left lower extremity ultrasound showed persistent nonocclusive chronic appearing DVT within the left popliteal vein, 06/03/2022, CT angiogram showed no Loyola Rummage syndrome. Patient currently takes Eliquis  2.5 mg daily [self dose reduction].  Patient would like to stop anticoagulation. Discussed with patient that Eliquis  2.5 mg twice daily is suggested prophylactic dose. Discussed about rational long-term anticoagulation prophylaxis due to the unprovoked DVT nature and prothrombin gene mutation heterozygous carrier state. Patient is interested in switching to Xarelto 10 mg daily.  New prescription was sent to pharmacy. If patient eventually decide to stop long-term anticoagulation, I recommend him to switch to aspirin 81 mg daily and be aware about red flag symptoms.

## 2024-08-06 ENCOUNTER — Encounter: Payer: Self-pay | Admitting: Internal Medicine

## 2024-08-10 NOTE — Progress Notes (Unsigned)
 Subjective:    Patient ID: Troy Perry, male    DOB: 14-Oct-1965, 58 y.o.   MRN: [id number]  HPI  Pt presents to the clinic today for his annual exam.  Flu: never Tetanus: > 10 years ago Covid:  x 2 Shingrix: never PSA screening: 11/2021 Colon screening: 12/2021, Cologuard Vision screening: annually Dentist: as needed  Diet: He does eat meat. He consumes fruits and veggies. He tries to avoid fried foods. He drinks mostly water with Crystal Light Exercise: Riding his bike multiple days per week  Review of Systems     Past Medical History:  Diagnosis Date   Allergy    Basal cell carcinoma 12/19/2021   Right mid lateral nose. Nodular pattern. Mohs scheduled.   Chicken pox    GERD (gastroesophageal reflux disease)     Current Outpatient Medications  Medication Sig Dispense Refill   fluticasone (FLONASE) 50 MCG/ACT nasal spray Place into both nostrils daily.     melatonin 5 MG TABS Take 5 mg by mouth at bedtime.     Multiple Vitamins-Minerals (MULTIVITAMIN WITH MINERALS) tablet Take 1 tablet by mouth daily.     Omega-3 Fatty Acids (FISH OIL) 1000 MG CAPS Take 1 capsule by mouth in the morning, at noon, and at bedtime.     rivaroxaban  (XARELTO ) 10 MG TABS tablet Take 1 tablet (10 mg total) by mouth daily. 30 tablet 5   No current facility-administered medications for this visit.    No Known Allergies  Family History  Problem Relation Age of Onset   Hyperlipidemia Father    Hypertension Father    Diabetes Father    Arthritis Maternal Grandfather    Cancer Maternal Grandfather        Prostate   Diabetes Paternal Grandfather     Social History   Socioeconomic History   Marital status: Single    Spouse name: Not on file   Number of children: Not on file   Years of education: Not on file   Highest education level: Not on file  Occupational History   Occupation: IT   Tobacco Use   Smoking status: Never   Smokeless tobacco: Never  Vaping Use   Vaping status:  Never Used  Substance and Sexual Activity   Alcohol use: Yes    Alcohol/week: 5.0 standard drinks of alcohol    Types: 5 Standard drinks or equivalent per week   Drug use: No   Sexual activity: Yes  Other Topics Concern   Not on file  Social History Narrative   Not on file   Social Drivers of Health   Tobacco Use: Low Risk (01/06/2024)   Patient History    Smoking Tobacco Use: Never    Smokeless Tobacco Use: Never    Passive Exposure: Not on file  Financial Resource Strain: Not on file  Food Insecurity: Not on file  Transportation Needs: Not on file  Physical Activity: Not on file  Stress: Not on file  Social Connections: Not on file  Intimate Partner Violence: Not on file  Depression (PHQ2-9): Low Risk (06/21/2022)   Depression (PHQ2-9)    PHQ-2 Score: 0  Alcohol Screen: Low Risk (12/03/2021)   Alcohol Screen    Last Alcohol Screening Score (AUDIT): 4  Housing: Not on file  Utilities: Not on file  Health Literacy: Not on file     Constitutional: Denies fever, malaise, fatigue, headache or abrupt weight changes.  HEENT: Denies eye pain, eye redness, ear pain, ringing in the  ears, wax buildup, runny nose, nasal congestion, bloody nose, or sore throat. Respiratory: Denies difficulty breathing, shortness of breath, cough or sputum production.   Cardiovascular: Denies chest pain, chest tightness, palpitations or swelling in the hands or feet.  Gastrointestinal: Denies abdominal pain, bloating, constipation, diarrhea or blood in the stool.  GU: Denies urgency, frequency, pain with urination, burning sensation, blood in urine, odor or discharge. Musculoskeletal: Denies decrease in range of motion, difficulty with gait, muscle pain, or joint pain or swelling.  Skin: Denies redness, rashes, lesions or ulcercations.  Neurological: Denies dizziness, difficulty with speech or problems with balance and coordination.  Psych: Denies anxiety, depression, SI/HI.  No other specific  complaints in a complete review of systems (except as listed in HPI above).  Objective:   Physical Exam  There were no vitals taken for this visit.  Wt Readings from Last 3 Encounters:  01/06/24 208 lb 6.4 oz (94.5 kg)  04/09/23 220 lb 12.8 oz (100.2 kg)  10/09/22 221 lb 4.8 oz (100.4 kg)    General: Appears his stated age, obese in NAD. Skin: Warm, dry and intact. No rashes, lesions or ulcerations noted. HEENT: Head: normal shape and size; Eyes: sclera white, no icterus, conjunctiva pink, PERRLA and EOMs intact; Ears: Tm's gray and intact, normal light reflex; Nose: mucosa pink and moist, septum midline; Throat/Mouth: Teeth present, mucosa pink and moist, no exudate, lesions or ulcerations noted.  Neck:  Neck supple, trachea midline. No masses, lumps or thyromegaly present.  Cardiovascular: Bradycardic with normal rhythm. S1,S2 noted.  No murmur, rubs or gallops noted. No JVD or BLE edema. No carotid bruits noted. Pulmonary/Chest: Normal effort and positive vesicular breath sounds. No respiratory distress. No wheezes, rales or ronchi noted.  Abdomen: Normal bowel sounds.  Musculoskeletal: Strength 5/5 BUE/BLE.  No difficulty with gait.  Neurological: Alert and oriented. Cranial nerves II-XII grossly intact. Coordination normal.  Psychiatric: Mood and affect normal. Behavior is normal. Judgment and thought content normal.    BMET    Component Value Date/Time   NA 137 01/06/2024 1321   K 4.1 01/06/2024 1321   CL 101 01/06/2024 1321   CO2 27 01/06/2024 1321   GLUCOSE 90 01/06/2024 1321   BUN 26 (H) 01/06/2024 1321   CREATININE 1.02 01/06/2024 1321   CREATININE 1.07 06/10/2022 1600   CALCIUM 9.1 01/06/2024 1321   GFRNONAA >60 01/06/2024 1321    Lipid Panel     Component Value Date/Time   CHOL 192 06/10/2022 1600   TRIG 383 (H) 06/10/2022 1600   HDL 66 06/10/2022 1600   CHOLHDL 2.9 06/10/2022 1600   LDLCALC 80 06/10/2022 1600    CBC    Component Value Date/Time   WBC  4.8 01/06/2024 1321   WBC 4.9 10/09/2022 1443   RBC 4.43 01/06/2024 1321   HGB 14.3 01/06/2024 1321   HCT 41.5 01/06/2024 1321   PLT 231 01/06/2024 1321   MCV 93.7 01/06/2024 1321   MCH 32.3 01/06/2024 1321   MCHC 34.5 01/06/2024 1321   RDW 12.1 01/06/2024 1321   LYMPHSABS 1.6 01/06/2024 1321   MONOABS 0.5 01/06/2024 1321   EOSABS 0.1 01/06/2024 1321   BASOSABS 0.0 01/06/2024 1321    Hgb A1C Lab Results  Component Value Date   HGBA1C 6.2 (H) 06/10/2022            Assessment & Plan:   Preventative Health Maintenance:  Flu shot He declines tetanus booster Encouraged him to get his covid booster Discussed Shingrix vaccine,  he will check coverage with his insurance company and schedule visit if he would like to have this done Colon screening UTD Encouraged him to consume a balanced diet and exercise regimen Advised him to see an eye doctor and dentist annually Will check CBC, CMET, Lipid, A1c , PSA oday  RTC in 6 months, follow up chronic conditions Angeline Laura, NP

## 2024-08-11 ENCOUNTER — Ambulatory Visit
Admission: RE | Admit: 2024-08-11 | Discharge: 2024-08-11 | Disposition: A | Discharge status: Home / Self Care | Source: Ambulatory Visit | Attending: Internal Medicine | Admitting: Internal Medicine

## 2024-08-11 ENCOUNTER — Ambulatory Visit: Admitting: Internal Medicine

## 2024-08-11 ENCOUNTER — Encounter: Payer: Self-pay | Admitting: Internal Medicine

## 2024-08-11 VITALS — BP 128/84 | Ht 70.0 in | Wt 209.0 lb

## 2024-08-11 DIAGNOSIS — G8929 Other chronic pain: Secondary | ICD-10-CM

## 2024-08-11 DIAGNOSIS — E663 Overweight: Secondary | ICD-10-CM | POA: Diagnosis not present

## 2024-08-11 DIAGNOSIS — Z0001 Encounter for general adult medical examination with abnormal findings: Secondary | ICD-10-CM | POA: Diagnosis not present

## 2024-08-11 DIAGNOSIS — E559 Vitamin D deficiency, unspecified: Secondary | ICD-10-CM | POA: Diagnosis not present

## 2024-08-11 DIAGNOSIS — M25511 Pain in right shoulder: Secondary | ICD-10-CM | POA: Diagnosis not present

## 2024-08-11 DIAGNOSIS — R202 Paresthesia of skin: Secondary | ICD-10-CM

## 2024-08-11 DIAGNOSIS — Z125 Encounter for screening for malignant neoplasm of prostate: Secondary | ICD-10-CM | POA: Diagnosis not present

## 2024-08-11 DIAGNOSIS — E538 Deficiency of other specified B group vitamins: Secondary | ICD-10-CM | POA: Diagnosis not present

## 2024-08-11 DIAGNOSIS — R7303 Prediabetes: Secondary | ICD-10-CM | POA: Diagnosis not present

## 2024-08-11 DIAGNOSIS — Z6829 Body mass index (BMI) 29.0-29.9, adult: Secondary | ICD-10-CM

## 2024-08-11 DIAGNOSIS — Z136 Encounter for screening for cardiovascular disorders: Secondary | ICD-10-CM

## 2024-08-11 NOTE — Patient Instructions (Signed)
 Health Maintenance, Male  Adopting a healthy lifestyle and getting preventive care are important in promoting health and wellness. Ask your health care provider about:  The right schedule for you to have regular tests and exams.  Things you can do on your own to prevent diseases and keep yourself healthy.  What should I know about diet, weight, and exercise?  Eat a healthy diet    Eat a diet that includes plenty of vegetables, fruits, low-fat dairy products, and lean protein.  Do not eat a lot of foods that are high in solid fats, added sugars, or sodium.  Maintain a healthy weight  Body mass index (BMI) is a measurement that can be used to identify possible weight problems. It estimates body fat based on height and weight. Your health care provider can help determine your BMI and help you achieve or maintain a healthy weight.  Get regular exercise  Get regular exercise. This is one of the most important things you can do for your health. Most adults should:  Exercise for at least 150 minutes each week. The exercise should increase your heart rate and make you sweat (moderate-intensity exercise).  Do strengthening exercises at least twice a week. This is in addition to the moderate-intensity exercise.  Spend less time sitting. Even light physical activity can be beneficial.  Watch cholesterol and blood lipids  Have your blood tested for lipids and cholesterol at 59 years of age, then have this test every 5 years.  You may need to have your cholesterol levels checked more often if:  Your lipid or cholesterol levels are high.  You are older than 59 years of age.  You are at high risk for heart disease.  What should I know about cancer screening?  Many types of cancers can be detected early and may often be prevented. Depending on your health history and family history, you may need to have cancer screening at various ages. This may include screening for:  Colorectal cancer.  Prostate cancer.  Skin cancer.  Lung  cancer.  What should I know about heart disease, diabetes, and high blood pressure?  Blood pressure and heart disease  High blood pressure causes heart disease and increases the risk of stroke. This is more likely to develop in people who have high blood pressure readings or are overweight.  Talk with your health care provider about your target blood pressure readings.  Have your blood pressure checked:  Every 3-5 years if you are 24-52 years of age.  Every year if you are 3 years old or older.  If you are between the ages of 60 and 72 and are a current or former smoker, ask your health care provider if you should have a one-time screening for abdominal aortic aneurysm (AAA).  Diabetes  Have regular diabetes screenings. This checks your fasting blood sugar level. Have the screening done:  Once every three years after age 66 if you are at a normal weight and have a low risk for diabetes.  More often and at a younger age if you are overweight or have a high risk for diabetes.  What should I know about preventing infection?  Hepatitis B  If you have a higher risk for hepatitis B, you should be screened for this virus. Talk with your health care provider to find out if you are at risk for hepatitis B infection.  Hepatitis C  Blood testing is recommended for:  Everyone born from [birth year] through 1966.  Anyone  with known risk factors for hepatitis C.  Sexually transmitted infections (STIs)  You should be screened each year for STIs, including gonorrhea and chlamydia, if:  You are sexually active and are younger than 59 years of age.  You are older than 59 years of age and your health care provider tells you that you are at risk for this type of infection.  Your sexual activity has changed since you were last screened, and you are at increased risk for chlamydia or gonorrhea. Ask your health care provider if you are at risk.  Ask your health care provider about whether you are at high risk for HIV. Your health care provider  may recommend a prescription medicine to help prevent HIV infection. If you choose to take medicine to prevent HIV, you should first get tested for HIV. You should then be tested every 3 months for as long as you are taking the medicine.  Follow these instructions at home:  Alcohol use  Do not drink alcohol if your health care provider tells you not to drink.  If you drink alcohol:  Limit how much you have to 0-2 drinks a day.  Know how much alcohol is in your drink. In the [place] one drink equals one 12 oz bottle of beer (355 mL), one 5 oz glass of wine (148 mL), or one 1 oz glass of hard liquor (44 mL).  Lifestyle  Do not use any products that contain nicotine or tobacco. These products include cigarettes, chewing tobacco, and vaping devices, such as e-cigarettes. If you need help quitting, ask your health care provider.  Do not use street drugs.  Do not share needles.  Ask your health care provider for help if you need support or information about quitting drugs.  General instructions  Schedule regular health, dental, and eye exams.  Stay current with your vaccines.  Tell your health care provider if:  You often feel depressed.  You have ever been abused or do not feel safe at home.  Summary  Adopting a healthy lifestyle and getting preventive care are important in promoting health and wellness.  Follow your health care provider's instructions about healthy diet, exercising, and getting tested or screened for diseases.  Follow your health care provider's instructions on monitoring your cholesterol and blood pressure.  This information is not intended to replace advice given to you by your health care provider. Make sure you discuss any questions you have with your health care provider.  Document Revised: 01/01/2021 Document Reviewed: 01/01/2021  Elsevier Patient Education  2024 [employer].

## 2024-08-11 NOTE — Assessment & Plan Note (Signed)
 Encourage diet and exercise for weight loss

## 2024-08-12 LAB — LIPID PANEL
Cholesterol: 190 mg/dL (ref <200)
HDL: 74 mg/dL (ref >=40)
LDL Cholesterol (Calc): 83 mg/dL
Non-HDL Cholesterol (Calc): 116 mg/dL (ref <130)
Total CHOL/HDL Ratio: 2.6 (calc) (ref <5.0)
Triglycerides: 237 mg/dL — ABNORMAL HIGH (ref <150)

## 2024-08-12 LAB — CBC
HCT: 45.3 % (ref 39.4–51.1)
Hemoglobin: 14.9 g/dL (ref 13.2–17.1)
MCH: 32 pg (ref 27.0–33.0)
MCHC: 32.9 g/dL (ref 31.6–35.4)
MCV: 97.2 fL (ref 81.4–101.7)
MPV: 9.5 fL (ref 7.5–12.5)
Platelets: 257 Thousand/uL (ref 140–400)
RBC: 4.66 Million/uL (ref 4.20–5.80)
RDW: 12.7 % (ref 11.0–15.0)
WBC: 3.2 Thousand/uL — ABNORMAL LOW (ref 3.8–10.8)

## 2024-08-12 LAB — COMPREHENSIVE METABOLIC PANEL WITH GFR
AG Ratio: 1.7 (calc) (ref 1.0–2.5)
ALT: 23 U/L (ref 9–46)
AST: 24 U/L (ref 10–35)
Albumin: 4.7 g/dL (ref 3.6–5.1)
Alkaline phosphatase (APISO): 53 U/L (ref 35–144)
BUN: 19 mg/dL (ref 7–25)
CO2: 29 mmol/L (ref 20–32)
Calcium: 9.8 mg/dL (ref 8.6–10.3)
Chloride: 103 mmol/L (ref 98–110)
Creat: 0.93 mg/dL (ref 0.70–1.30)
Globulin: 2.8 g/dL (ref 1.9–3.7)
Glucose, Bld: 97 mg/dL (ref 65–99)
Potassium: 5 mmol/L (ref 3.5–5.3)
Sodium: 142 mmol/L (ref 135–146)
Total Bilirubin: 0.4 mg/dL (ref 0.2–1.2)
Total Protein: 7.5 g/dL (ref 6.1–8.1)
eGFR: 95 mL/min/1.73m2 (ref >=60)

## 2024-08-12 LAB — VITAMIN B12: Vitamin B-12: 393 pg/mL (ref 200–1100)

## 2024-08-12 LAB — VITAMIN D 25 HYDROXY (VIT D DEFICIENCY, FRACTURES): Vit D, 25-Hydroxy: 42 ng/mL (ref 30–100)

## 2024-08-12 LAB — HEMOGLOBIN A1C
Hgb A1c MFr Bld: 5.8 % — ABNORMAL HIGH (ref <5.7)
Mean Plasma Glucose: 120 mg/dL
eAG (mmol/L): 6.6 mmol/L

## 2024-08-12 LAB — TSH: TSH: 1.06 m[IU]/L (ref 0.40–4.50)

## 2024-08-12 LAB — PSA: PSA: 0.56 ng/mL (ref <=4.00)

## 2024-10-04 ENCOUNTER — Inpatient Hospital Stay: Admitting: Oncology

## 2024-10-04 ENCOUNTER — Inpatient Hospital Stay

## 2025-02-14 ENCOUNTER — Ambulatory Visit: Admitting: Internal Medicine
